# Patient Record
Sex: Female | Born: 1950 | Race: Black or African American | Hispanic: No | Marital: Single | Smoking: Current every day smoker
Health system: Southern US, Community
[De-identification: ages and names within clinical notes are randomized; demographics above are authoritative.]

## PROBLEM LIST (undated history)

## (undated) DIAGNOSIS — F319 Bipolar disorder, unspecified: Secondary | ICD-10-CM

## (undated) DIAGNOSIS — G8190 Hemiplegia, unspecified affecting unspecified side: Secondary | ICD-10-CM

## (undated) DIAGNOSIS — M1990 Unspecified osteoarthritis, unspecified site: Secondary | ICD-10-CM

## (undated) DIAGNOSIS — I639 Cerebral infarction, unspecified: Secondary | ICD-10-CM

## (undated) DIAGNOSIS — I878 Other specified disorders of veins: Secondary | ICD-10-CM

## (undated) DIAGNOSIS — K219 Gastro-esophageal reflux disease without esophagitis: Secondary | ICD-10-CM

## (undated) DIAGNOSIS — R600 Localized edema: Secondary | ICD-10-CM

## (undated) HISTORY — PX: KNEE ARTHROSCOPY: SUR90

---

## 2015-06-11 ENCOUNTER — Emergency Department (HOSPITAL_COMMUNITY)
Admission: EM | Admit: 2015-06-11 | Discharge: 2015-06-11 | Disposition: A | Discharge status: Home / Self Care | Payer: Medicare HMO | Attending: Emergency Medicine | Admitting: Emergency Medicine

## 2015-06-11 ENCOUNTER — Encounter (HOSPITAL_COMMUNITY): Payer: Self-pay | Admitting: Emergency Medicine

## 2015-06-11 DIAGNOSIS — Z8673 Personal history of transient ischemic attack (TIA), and cerebral infarction without residual deficits: Secondary | ICD-10-CM | POA: Diagnosis not present

## 2015-06-11 DIAGNOSIS — Y998 Other external cause status: Secondary | ICD-10-CM | POA: Insufficient documentation

## 2015-06-11 DIAGNOSIS — F319 Bipolar disorder, unspecified: Secondary | ICD-10-CM | POA: Insufficient documentation

## 2015-06-11 DIAGNOSIS — Z79899 Other long term (current) drug therapy: Secondary | ICD-10-CM | POA: Diagnosis not present

## 2015-06-11 DIAGNOSIS — Y9389 Activity, other specified: Secondary | ICD-10-CM | POA: Insufficient documentation

## 2015-06-11 DIAGNOSIS — Z7982 Long term (current) use of aspirin: Secondary | ICD-10-CM | POA: Diagnosis not present

## 2015-06-11 DIAGNOSIS — M1990 Unspecified osteoarthritis, unspecified site: Secondary | ICD-10-CM | POA: Diagnosis not present

## 2015-06-11 DIAGNOSIS — W19XXXA Unspecified fall, initial encounter: Secondary | ICD-10-CM

## 2015-06-11 DIAGNOSIS — Z8669 Personal history of other diseases of the nervous system and sense organs: Secondary | ICD-10-CM | POA: Diagnosis not present

## 2015-06-11 DIAGNOSIS — Z043 Encounter for examination and observation following other accident: Secondary | ICD-10-CM | POA: Insufficient documentation

## 2015-06-11 DIAGNOSIS — K219 Gastro-esophageal reflux disease without esophagitis: Secondary | ICD-10-CM | POA: Diagnosis not present

## 2015-06-11 DIAGNOSIS — Z880 Allergy status to penicillin: Secondary | ICD-10-CM | POA: Diagnosis not present

## 2015-06-11 DIAGNOSIS — W1789XA Other fall from one level to another, initial encounter: Secondary | ICD-10-CM | POA: Diagnosis not present

## 2015-06-11 DIAGNOSIS — Y9289 Other specified places as the place of occurrence of the external cause: Secondary | ICD-10-CM | POA: Diagnosis not present

## 2015-06-11 HISTORY — DX: Hemiplegia, unspecified affecting unspecified side: G81.90

## 2015-06-11 HISTORY — DX: Cerebral infarction, unspecified: I63.9

## 2015-06-11 HISTORY — DX: Unspecified osteoarthritis, unspecified site: M19.90

## 2015-06-11 HISTORY — DX: Gastro-esophageal reflux disease without esophagitis: K21.9

## 2015-06-11 HISTORY — DX: Bipolar disorder, unspecified: F31.9

## 2015-06-11 NOTE — ED Provider Notes (Signed)
CSN: [id number]     Arrival date & time 06/11/15  1244 History   None    Chief Complaint  Patient presents with  . Fall     (Consider location/radiation/quality/duration/timing/severity/associated sxs/prior Treatment) Patient is a 64 y.o. female presenting with fall. The history is provided by the patient and the EMS personnel. No language interpreter was used.  Fall Pertinent negatives include no abdominal pain, chest pain, headaches or vomiting.   Betty Buchanan is a 64 year old female with a history of a stroke and bipolar disorder who [facility] by [facility] for an unwitnessed fall while in the elevator. The patient states she tripped over her shoe while using her walker but denies any loss of consciousness or head injury. She states she takes daily aspirin for her heart. 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 She states she is not in any pain. She denies any recent illness, cough, shortness of breath, chest pain, headache, abdominal pain, vomiting, or diarrhea.  Past Medical History  Diagnosis Date  . Bipolar 1 disorder (HCC)   . Stroke (HCC)   . Hemiplegia (HCC)     L side  . Osteoarthritis   . GERD (gastroesophageal reflux disease)    History reviewed. No pertinent past surgical history. History reviewed. No pertinent family history. Social History  Substance Use Topics  . Smoking status: Never Smoker   . Smokeless tobacco: None  . Alcohol Use: No   OB History    No data available     Review of Systems  Respiratory: Negative for shortness of breath.   Cardiovascular: Negative for chest pain.  Gastrointestinal: Negative for vomiting and abdominal pain.  Skin: Negative for wound.  Neurological: Negative for syncope, facial asymmetry and headaches.  All other systems reviewed and are negative.     Allergies  Penicillins  Home Medications   Prior to Admission medications   Medication Sig Start  Date End Date Taking? Authorizing Provider  aspirin EC 81 MG tablet Take 81 mg by mouth daily with breakfast.   Yes Historical Provider, MD  atorvastatin (LIPITOR) 40 MG tablet Take 40 mg by mouth every evening.   Yes Historical Provider, MD  calcium carbonate (OS-CAL - DOSED IN MG OF ELEMENTAL CALCIUM) 1250 (500 CA) MG tablet Take 1 tablet by mouth daily with breakfast.   Yes Historical Provider, MD  divalproex (DEPAKOTE SPRINKLE) 125 MG capsule Take 500-1,000 mg by mouth 2 (two) times daily. Takes 500mg  in the morning and 1000mg  at night   Yes Historical Provider, MD  LORazepam (ATIVAN) 1 MG tablet Take 1 mg by mouth every 4 (four) hours as needed for anxiety (and agitation).   Yes Historical Provider, MD  Melatonin 1 MG TABS Take 2 mg by mouth at bedtime.   Yes Historical Provider, MD  Multiple Vitamin (DAILY VITE) TABS Take 1 tablet by mouth daily with breakfast.   Yes Historical Provider, MD  OLANZapine (ZYPREXA) 10 MG tablet Take 5 mg by mouth 2 (two) times daily. Takes along with the 1/2 tablet of a 20mg  tablet equals 15mg    Yes Historical Provider, MD  OLANZapine (ZYPREXA) 20 MG tablet Take 10 mg by mouth 2 (two) times daily. Takes along with 1/2 of a 10mg  tablet equals 15mg    Yes Historical Provider, MD  pantoprazole (PROTONIX) 40 MG tablet Take 40 mg by mouth daily with breakfast.   Yes Historical Provider, MD  thiothixene (NAVANE) 10  MG capsule Take 10 mg by mouth 3 (three) times daily.   Yes Historical Provider, MD   BP 126/82 mmHg  Pulse 64  Temp(Src) 98 F (36.7 C) (Oral)  Resp 16  SpO2 100% Physical Exam  Constitutional: She is oriented to person, place, and time. She appears well-developed and well-nourished.  HENT:  Head: Normocephalic and atraumatic. Head is without raccoon's eyes, without Battle's sign, without abrasion, without contusion, without laceration, without right periorbital erythema and without left periorbital erythema. Hair is normal.  No evidence of head  injury. No lacerations or abrasions.  Eyes: Conjunctivae are normal.  Neck: Normal range of motion. Neck supple.  Cardiovascular: Normal rate, regular rhythm and normal heart sounds.   Pulmonary/Chest: Effort normal and breath sounds normal.  Lungs clear to auscultation bilaterally. No wheezing or decreased breath sounds.  Abdominal: Soft. There is no tenderness.  Abdomen is soft and nontender. Nondistended. No guarding or rebound.  Musculoskeletal: Normal range of motion.  She is able to move all extremities without difficulty.  Neurological: She is alert and oriented to person, place, and time. She has normal strength. No sensory deficit. She displays a negative Romberg sign. GCS eye subscore is 4. GCS verbal subscore is 5. GCS motor subscore is 6.  She is able to answer questions appropriately. GCS of 15. Cranial nerves III through XII intact. Normal coordination. 5/5 strength with dorsi and plantar flexion and grip strength.  No facial droop or difficulty speaking.  Skin: Skin is warm and dry.  Psychiatric: She has a normal mood and affect. Her behavior is normal.  Nursing note and vitals reviewed.   ED Course  Procedures (including critical care time) Labs Review Labs Reviewed - No data to display  Imaging Review No results found.   EKG Interpretation None      MDM   Final diagnoses:  Accident due to mechanical fall without injury  Patient presents for mechanical fall after tripping over her shoe while using a walker. She is neurovascularly intact and able to tell me what happened. She has no altered mental status, chest pain, shortness of breath, or headache. She denies being any pain at this time.  She is on 81 mg of aspirin daily. She denies hitting her head or any loss of consciousness. I do not believe she needs imaging at this time. She was able to ambulate with assistance using a walker. I believe she is stable for discharge back to hold and headaches. Return  precautions were explained to her. She verbally agreed.    Catha Gosselin, PA-C 06/11/15 1527  Jerelyn Scott, MD 06/11/15 [phone]

## 2015-06-11 NOTE — ED Notes (Signed)
Pt ambulated to restroom with walker which is pt's norm.

## 2015-06-11 NOTE — ED Notes (Signed)
[facility] Expected date:  Expected time:  Means of arrival:  Comments: EMS-fall from wheelchair

## 2015-06-11 NOTE — ED Notes (Signed)
Per EMS. [facility] status [facility]. Hx of stroke and bipolar, oriented per norm. Today pt had a fall in the elevator. Pt denies hitting her head or having any pain. Sent here for evaluation following fall per nursing home policy. Pt on aspirin, but no other blood thinners.

## 2015-06-11 NOTE — Discharge Instructions (Signed)
Fall Prevention in Hospitals, Adult Follow-up with a primary care physician at [facility] or use the resource guide below to find a provider. Return for any headache, chest pain, shortness of breath, difficulty with speech or coordination. As a hospital patient, your condition and the treatments you receive can increase your risk for falls. Some additional risk factors for falls in a hospital include:  Being in an unfamiliar environment.  Being on bed rest.  Your surgery.  Taking certain medicines.  Your tubing requirements, such as intravenous (IV) therapy or catheters. It is important that you learn how to decrease fall risks while at the hospital. Below are important tips that can help prevent falls. SAFETY TIPS FOR PREVENTING FALLS Talk about your risk of falling.  Ask your health care provider why you are at risk for falling. Is it your medicine, illness, tubing placement, or something else?  Make a plan with your health care provider to keep you safe from falls.  Ask your health care provider or pharmacist about side effects of your medicines. Some medicines can make you dizzy or affect your coordination. Ask for help.  Ask for help before getting out of bed. You may need to press your call button.  Ask for assistance in getting safely to the toilet.  Ask for a walker or cane to be put at your bedside. Ask that most of the side rails on your bed be placed up before your health care provider leaves the room.  Ask family or friends to sit with you.  Ask for things that are out of your reach, such as your glasses, hearing aids, telephone, bedside table, or call button. Follow these tips to avoid falling:  Stay lying or seated, rather than standing, while waiting for help.  Wear rubber-soled slippers or shoes whenever you walk in the hospital.  Avoid quick, sudden movements.  Change positions slowly.  Sit on the side of your bed before standing.  Stand up slowly and  wait before you start to walk.  Let your health care provider know if there is a spill on the floor.  Pay careful attention to the medical equipment, electrical cords, and tubes around you.  When you need help, use your call button by your bed or in the bathroom. Wait for one of your health care providers to help you.  If you feel dizzy or unsure of your footing, return to bed and wait for assistance.  Avoid being distracted by the TV, telephone, or another person in your room.  Do not lean or support yourself on rolling objects, such as IV poles or bedside tables.   This information is not intended to replace advice given to you by your health care provider. Make sure you discuss any questions you have with your health care provider.   Document Released: 08/07/2000 Document Revised: 08/31/2014 Document Reviewed: 04/17/2012 Elsevier Interactive Patient Education 2016 [employer].  Emergency Department Resource Guide 1) Find a Doctor and Pay Out of Pocket Although you won't have to find out who is covered by your insurance plan, it is a good idea to ask around and get recommendations. You will then need to call the office and see if the doctor you have chosen will accept you as a new patient and what types of options they offer for patients who are self-pay. Some doctors offer discounts or will set up payment plans for their patients who do not have insurance, but you will need to ask so you  aren't surprised when you get to your appointment.  2) Contact Your Local Health Department Not all health departments have doctors that can see patients for sick visits, but many do, so it is worth a call to see if yours does. If you don't know where your local health department is, you can check in your phone book. The CDC also has a tool to help you locate your state's health department, and many state websites also have listings of all of their local health departments.  3) Find a Walk-in  Clinic If your illness is not likely to be very severe or complicated, you may want to try a walk in clinic. These are popping up all over the country in pharmacies, drugstores, and shopping centers. They're usually staffed by nurse practitioners or physician assistants that have been trained to treat common illnesses and complaints. They're usually fairly quick and inexpensive. However, if you have serious medical issues or chronic medical problems, these are probably not your best option.  No Primary Care Doctor: - Call Health Connect at  [phone] - they can help you locate a primary care doctor that  accepts your insurance, provides certain services, etc. - Physician Referral Service- [phone]  Chronic Pain Problems: Organization         Address  Phone   Notes  [facility]  [phone] Patients need to be referred by their primary care doctor.   Medication Assistance: Organization         Address  Phone   Notes  [facility] Medication [facility] [address] [place], [place] [address] [phone] --Must be a resident of [facility] -- Must have NO insurance coverage whatsoever (no Medicaid/ Medicare, etc.) -- The pt. MUST have a primary care doctor that directs their care regularly and follows them in the community   [facility]ist  [phone]   [employer]  [phone]    Agencies that provide inexpensive medical care: Organization         Address  Phone   Notes  [facility]  [phone]   [facility]    [phone]   [facility] [address] [place], [place] [address] [phone]   [facility] [address] [place] [place] [phone]   Planned Parenthood    [phone]   [facility]    [phone]   Community Health and [facility]  [address], [place] Phone:  [phone], Fax:  [phone] Hours  of Operation:  9 am - 6 pm, M-F.  Also accepts Medicaid/Medicare and self-pay.  [facility]  [address], [place] Phone: [phone], Fax: [phone]. Hours of Operation:  8:30 am - 5:30 pm, M-F.  Also accepts Medicaid and self-pay.  [facility] [address], [place] Phone: [phone]   [facility] [address] Natasha Bence [place], [place] [phone], Ext. [phone] Mondays & Thursdays: 7-9 AM.  First 15 patients are seen on a first come, first serve basis.    Medicaid-accepting [facility] Providers:  Organization         Address  Phone   Notes  [facility] [address], [place] [phone] Also accepts self-pay patients.  [facility] [address] Laurell Josephs [place], [place]  [phone]  [place], [address], [place] [phone]   [place] [address], [place] [phone]   Lucianne Lei [address], [place]   [phone] Only accepts [place] Access [place] patients after they have their name applied to their card.   Self-Pay (no insurance) in [facility]:  Organization         Address  Phone   Notes  Sickle Cell Patients, [facility] [place] [phone]   [facility] Urgent Care [place] [phone]   [facility] [place]  [place], [place], [place] [phone]   [facility]/Dr. Osei-Bonsu  [address] [place] or [address], [place] [phone] Phone number for both [place] and [place] locations is the same.  Urgent Medical and [facility] [address], [place] [phone]   [place] [facility] [address], [place] or [address] [phone]   [facility] [address] [place] [phone], phone; [phone], fax Sees patients 1st and 3rd Saturday of every month.  Must not qualify for public or private insurance (i.e. Medicaid, Medicare, [place] Health Choice, Veterans' Benefits)  Household income should be no more than 200% of the poverty level The clinic cannot treat you if you are pregnant or think you are pregnant  Sexually transmitted diseases are not treated at the clinic.    Dental Care: Organization         Address  Phone  Notes  [facility] Department of [facility] [phone] Accepts children up to age 64 who are enrolled in [place] or [place]; pregnant women with a Medicaid card; and children who have applied for Medicaid or [place] Health Choice, but were declined, whose parents can pay a reduced fee at time of service.  [facility] Department of [facility]  [address], [place] [phone] Accepts children up to age 4 who are enrolled in [place] or [place]; pregnant women with a Medicaid card; and children who have applied for Medicaid or [place] Health Choice, but were declined, whose parents can pay a reduced fee at time of service.  [place] Adult Dental Access PROGRAM  [place] [phone] Patients are seen by appointment only. Walk-ins are not accepted. [place] will see patients 4 years of age and older. Monday - Tuesday (8am-5pm) Most Wednesdays (8:30-5pm) $30 per visit, cash only  [facility] Adult Dental Access PROGRAM  [address], [facility] [phone] Patients are seen by appointment only. Walk-ins are not accepted. [place] will see patients 44 years of age and older. One Wednesday Evening (Monthly: Volunteer Based).  $30 per visit, cash only  [place]  [phone] for adults; Children under age 48, call Graduate  Pediatric Dentistry at [phone]. Children aged 73-14, please call [phone] to request a pediatric application.  Dental services are provided in all areas of dental care including fillings, crowns and bridges, complete and partial dentures, implants, gum treatment, root canals, and extractions. Preventive care is also provided. Treatment is provided to both adults and children. Patients are selected via a lottery and there is often a waiting list.  [facility] [address], [place]  [phone] [email or web address]   [facility] [address] [place], [place] [phone], Ext. [phone] Second and Fourth Thursday of each month, opens at 6:30 AM; Clinic ends at 9 AM.  Patients are seen on a first-come first-served basis, and a limited number are seen during each clinic.   [facility]  [address] Hillard Danker [place], [place] [phone]   Eligibility Requirements You must have lived in [place], [place], or [place] counties for at least the last three months.   You cannot be eligible for state or federal sponsored [employer], including [employer], [place], or [employer].   You generally cannot be eligible for healthcare insurance through your employer.    How to apply: Eligibility screenings are held every Tuesday and Wednesday afternoon from 1:00 pm until 4:00 pm. You do not need an appointment for the interview!  [facility] [address], [place], [place]   [place]  [place] Department  [place]  [phone]    Behavioral Health Resources in the Community: Intensive Outpatient Programs Organization         Address  Phone  Notes  [place] [place]. [address], [place], [place] [phone]   [facility] [address] [place], [place]   ADS: Alcohol & Drug Svcs [address] [place], [place]   Trainer [address]  [employer], [place] or [phone]   Substance Abuse Resources Organization         Address  Phone  Notes  Alcohol and Drug Services  [phone]   [place]  [phone]   [facility] [place]   Chinita Pester  [phone]   Residential & Outpatient Substance Abuse Program  [phone]   Psychological Services Organization         Address  Phone  Notes  [facility]  [phone]   [place] [address] [place] or [phone]    Mobile Crisis Teams Organization         Address  Phone  Notes  [facility], Mobile Crisis Care Unit  [phone]   [facility]  [address] [place], [place]   [place] [address] [place] [phone]    Self-Help/Support Groups Organization         Address  Phone             Notes  [place]. of [place] - variety of support groups  [place] Call for more information  Narcotics Anonymous (NA), [facility] [address], [employer] [place]  2 meetings at this location   Special educational needs teacher         Address  Phone  Notes  [facility]idential Treatment [place],    [place]  [phone]   [facility] [facility]  [address], [place] 937169, [place], [place]   [place], [place] [phone] Admissions: 8am-3pm M-F  Incentives Substance [place] [address],    [place], [place] [phone]   [facility] [address] [place], [place], [place]   The [place].,  [place], [place] [phone]   [facility] - Intensive Outpatient [address]ure[address] Josephs[address], [place], [place] [phone]   [facility] ([facility] [address]  [place], [place] [phone] or [phone]   [facility] (RTS) [address] [place], [place] [phone] Accepts Medicaid  [facility] [address]  [place] [phone] Substance Abuse/Addiction Treatment   [facility] Behavioral Health Resources Organization         Address  Phone  Notes  [facility]  [phone]) [phone]   Angie FavaJulie Brannon, PhD [address] [place], [place]   [phone](336) [phone] or [phone]   [facility]   [address] [place], [place] [phone](336) [phone]   [facility] [address], [place], [place] [phone] Insurance/Medicaid/sponsorship through [facility]  [facility] [address]                                    [place], [place] [phone]) [phone] Therapy/tele-psych/case  [facility] [address]   [place], [place] [phone]) [phone]    Dr. Lolly MustacheArfeen  [phone](336) [phone]   [facility]  [employer] [facility] 1) [address], [place] 2) [address], [place] 3)  [address] [place], [place] [phone](336) [phone]  [phone]   [facility] Child Abuse Hotline [phone](336) [phone] or [phone](336) [phone] (After Hours)

## 2015-07-08 ENCOUNTER — Emergency Department (HOSPITAL_COMMUNITY)
Admission: EM | Admit: 2015-07-08 | Discharge: 2015-07-09 | Disposition: A | Discharge status: Home / Self Care | Payer: Medicare HMO | Attending: Emergency Medicine | Admitting: Emergency Medicine

## 2015-07-08 ENCOUNTER — Encounter (HOSPITAL_COMMUNITY): Payer: Self-pay | Admitting: Emergency Medicine

## 2015-07-08 DIAGNOSIS — W19XXXA Unspecified fall, initial encounter: Secondary | ICD-10-CM

## 2015-07-08 DIAGNOSIS — Z7982 Long term (current) use of aspirin: Secondary | ICD-10-CM | POA: Diagnosis not present

## 2015-07-08 DIAGNOSIS — F319 Bipolar disorder, unspecified: Secondary | ICD-10-CM | POA: Diagnosis not present

## 2015-07-08 DIAGNOSIS — W06XXXA Fall from bed, initial encounter: Secondary | ICD-10-CM | POA: Insufficient documentation

## 2015-07-08 DIAGNOSIS — Y92129 Unspecified place in nursing home as the place of occurrence of the external cause: Secondary | ICD-10-CM | POA: Insufficient documentation

## 2015-07-08 DIAGNOSIS — Z043 Encounter for examination and observation following other accident: Secondary | ICD-10-CM | POA: Insufficient documentation

## 2015-07-08 DIAGNOSIS — K219 Gastro-esophageal reflux disease without esophagitis: Secondary | ICD-10-CM | POA: Insufficient documentation

## 2015-07-08 DIAGNOSIS — Z8673 Personal history of transient ischemic attack (TIA), and cerebral infarction without residual deficits: Secondary | ICD-10-CM | POA: Insufficient documentation

## 2015-07-08 DIAGNOSIS — N390 Urinary tract infection, site not specified: Secondary | ICD-10-CM | POA: Insufficient documentation

## 2015-07-08 DIAGNOSIS — Z880 Allergy status to penicillin: Secondary | ICD-10-CM | POA: Diagnosis not present

## 2015-07-08 DIAGNOSIS — Y998 Other external cause status: Secondary | ICD-10-CM | POA: Diagnosis not present

## 2015-07-08 DIAGNOSIS — Z8669 Personal history of other diseases of the nervous system and sense organs: Secondary | ICD-10-CM | POA: Insufficient documentation

## 2015-07-08 DIAGNOSIS — Z79899 Other long term (current) drug therapy: Secondary | ICD-10-CM | POA: Insufficient documentation

## 2015-07-08 DIAGNOSIS — Y9389 Activity, other specified: Secondary | ICD-10-CM | POA: Diagnosis not present

## 2015-07-08 DIAGNOSIS — M1990 Unspecified osteoarthritis, unspecified site: Secondary | ICD-10-CM | POA: Diagnosis not present

## 2015-07-08 NOTE — ED Notes (Signed)
Patient had an unwitnessed fall [facility]. Patient is not complaining of any pain.

## 2015-07-08 NOTE — ED Notes (Signed)
Bed: WA12 Expected date:  Expected time:  Means of arrival:  Comments: EMS 

## 2015-07-08 NOTE — ED Provider Notes (Signed)
CSN: [id number]     Arrival date & time 07/08/15  2317 History  By signing my name below, I, Phillis HaggisGabriella Gaje, attest that this documentation has been prepared under the direction and in the presence of Cova Knieriem Se. Electronically Signed: Phillis HaggisGabriella Gaje, ED Scribe. 07/08/2015. 11:29 PM.   Chief Complaint  Patient presents with  . Fall   The history is provided by the patient. No language interpreter was used.  HPI Comments: Betty Buchanan is a 64 y.o. Female with hx of stroke, hemiplegia and osteoarthritis brought in by EMS who presents to the Emergency Department complaining of a fall onset PTA. Pt had an unwitnessed fall at [facility]. Pt states that she was trying to lean over her bed to get a snack and tipped over out of bed. She denies hitting head and currently denies pain. Nurse denies that pt has fever.   Past Medical History  Diagnosis Date  . Bipolar 1 disorder (HCC)   . Stroke (HCC)   . Hemiplegia (HCC)     L side  . Osteoarthritis   . GERD (gastroesophageal reflux disease)    History reviewed. No pertinent past surgical history. History reviewed. No pertinent family history. Social History  Substance Use Topics  . Smoking status: Never Smoker   . Smokeless tobacco: None  . Alcohol Use: No   OB History    No data available     Review of Systems  Constitutional: Negative for fever.  Neurological: Negative for syncope and headaches.  All other systems reviewed and are negative.   Allergies  Penicillins  Home Medications   Prior to Admission medications   Medication Sig Start Date End Date Taking? Authorizing Provider  aspirin EC 81 MG tablet Take 81 mg by mouth daily with breakfast.    Historical Provider, MD  atorvastatin (LIPITOR) 40 MG tablet Take 40 mg by mouth every evening.    Historical Provider, MD  calcium carbonate (OS-CAL - DOSED IN MG OF ELEMENTAL CALCIUM) 1250 (500 CA) MG tablet Take 1 tablet by mouth daily with breakfast.     Historical Provider, MD  divalproex (DEPAKOTE SPRINKLE) 125 MG capsule Take 500-1,000 mg by mouth 2 (two) times daily. Takes 500mg  in the morning and 1000mg  at night    Historical Provider, MD  LORazepam (ATIVAN) 1 MG tablet Take 1 mg by mouth every 4 (four) hours as needed for anxiety (and agitation).    Historical Provider, MD  Melatonin 1 MG TABS Take 2 mg by mouth at bedtime.    Historical Provider, MD  Multiple Vitamin (DAILY VITE) TABS Take 1 tablet by mouth daily with breakfast.    Historical Provider, MD  OLANZapine (ZYPREXA) 10 MG tablet Take 5 mg by mouth 2 (two) times daily. Takes along with the 1/2 tablet of a 20mg  tablet equals 15mg     Historical Provider, MD  OLANZapine (ZYPREXA) 20 MG tablet Take 10 mg by mouth 2 (two) times daily. Takes along with 1/2 of a 10mg  tablet equals 15mg     Historical Provider, MD  pantoprazole (PROTONIX) 40 MG tablet Take 40 mg by mouth daily with breakfast.    Historical Provider, MD  thiothixene (NAVANE) 10 MG capsule Take 10 mg by mouth 3 (three) times daily.    Historical Provider, MD   BP 128/68 mmHg  Pulse 72  Temp(Src) 98.2 F (36.8 C) (Oral)  Resp 18  Ht 5\' 5"  (1.651 m)  Wt 200 lb (90.719 kg)  BMI 33.28 kg/m2  SpO2 98%  Physical Exam  Constitutional: She is oriented to person, place, and time. She appears well-developed and well-nourished. No distress.  HENT:  Head: Normocephalic and atraumatic.  Eyes: Conjunctivae and EOM are normal.  Neck: Normal range of motion. Neck supple.  Cardiovascular: Normal rate and regular rhythm.  Exam reveals no gallop and no friction rub.   No murmur heard. Pulmonary/Chest: Effort normal and breath sounds normal. She has no wheezes. She has no rales. She exhibits no tenderness.  Abdominal: Soft. She exhibits no distension. There is no tenderness.  Musculoskeletal: Normal range of motion.  Neurological: She is alert and oriented to person, place, and time. Coordination normal.  Speech is goal-oriented.  Moves limbs without ataxia.   Skin: Skin is warm and dry.  Psychiatric: She has a normal mood and affect. Her behavior is normal.  Nursing note and vitals reviewed.   ED Course  Procedures (including critical care time) DIAGNOSTIC STUDIES: Oxygen Saturation is 98% on RA, normal by my interpretation.    COORDINATION OF CARE: 11:38 PM-Discussed treatment plan with pt at bedside and pt agreed to plan.   Labs Review Labs Reviewed  URINALYSIS, ROUTINE W REFLEX MICROSCOPIC (NOT AT [facility]) - Abnormal; Notable for the following:    APPearance TURBID (*)    Hgb urine dipstick LARGE (*)    Protein, ur 100 (*)    Nitrite POSITIVE (*)    Leukocytes, UA LARGE (*)    All other components within normal limits  URINE MICROSCOPIC-ADD ON - Abnormal; Notable for the following:    Squamous Epithelial / LPF NONE SEEN (*)    Bacteria, UA FEW (*)    All other components within normal limits  URINE CULTURE    Imaging Review Ct Head Wo Contrast  07/09/2015  CLINICAL DATA:  Larey Seat out of bed while leaning over trying to get snack. Concern for head injury. Initial encounter. EXAM: CT HEAD WITHOUT CONTRAST TECHNIQUE: Contiguous axial images were obtained from the base of the skull through the vertex without intravenous contrast. COMPARISON:  None. FINDINGS: There is no evidence of acute infarction, mass lesion, or intra- or extra-axial hemorrhage on CT. Chronic encephalomalacia is noted at the right parietal lobe, reflecting remote infarct. This involves portions of the right basal ganglia. The posterior fossa, including the cerebellum, brainstem and fourth ventricle, is within normal limits. The third and lateral ventricles are unremarkable in appearance. No mass effect or midline shift is seen. There is no evidence of fracture; visualized osseous structures are unremarkable in appearance. The visualized portions of the orbits are within normal limits. The paranasal sinuses and mastoid air cells are well-aerated.  No significant soft tissue abnormalities are seen. IMPRESSION: 1. No evidence of traumatic intracranial injury or fracture. 2. Chronic encephalomalacia at the right parietal lobe, reflecting remote infarct. Electronically Signed   By: Roanna Raider M.D.   On: 07/09/2015 01:34   I have personally reviewed and evaluated these images and lab results as part of my medical decision-making.   EKG Interpretation None      MDM   Final diagnoses:  Fall, initial encounter  UTI (lower urinary tract infection)   Patient's head CT unremarkable for acute changes. Patient has a UTI and will be treated with bactrim. Vitals stable and patient afebrile.   I personally performed the services described in this documentation, which was scribed in my presence. The recorded information has been reviewed and is accurate.     Emilia Beck, PA-C 07/09/15 [phone]  Devoria Albe, MD 07/09/15 [phone]

## 2015-07-09 ENCOUNTER — Emergency Department (HOSPITAL_COMMUNITY): Payer: Medicare HMO

## 2015-07-09 DIAGNOSIS — Z043 Encounter for examination and observation following other accident: Secondary | ICD-10-CM | POA: Diagnosis not present

## 2015-07-09 LAB — URINE MICROSCOPIC-ADD ON: Squamous Epithelial / LPF: NONE SEEN — AB

## 2015-07-09 LAB — URINALYSIS, ROUTINE W REFLEX MICROSCOPIC
Bilirubin Urine: NEGATIVE
GLUCOSE, UA: NEGATIVE mg/dL
Ketones, ur: NEGATIVE mg/dL
Nitrite: POSITIVE — AB
PH: 7.5 (ref 5.0–8.0)
PROTEIN: 100 mg/dL — AB
Specific Gravity, Urine: 1.019 (ref 1.005–1.030)
Urobilinogen, UA: 1 mg/dL (ref 0.0–1.0)

## 2015-07-09 MED ORDER — SULFAMETHOXAZOLE-TRIMETHOPRIM 800-160 MG PO TABS
1.0000 | ORAL_TABLET | Freq: Once | ORAL | Status: AC
  Administered 2015-07-09: 1 via ORAL
  Filled 2015-07-09: qty 1

## 2015-07-09 MED ORDER — SULFAMETHOXAZOLE-TRIMETHOPRIM 800-160 MG PO TABS: 1.0000 | ORAL_TABLET | Freq: Two times a day (BID) | ORAL | Status: AC

## 2015-07-09 NOTE — Discharge Instructions (Signed)
Take Bactrim as directed until gone. Refer to attached documents for more information.  °

## 2015-07-09 NOTE — ED Notes (Signed)
Patient refused CT scan.

## 2015-07-10 ENCOUNTER — Encounter (HOSPITAL_COMMUNITY): Payer: Self-pay | Admitting: *Deleted

## 2015-07-10 ENCOUNTER — Emergency Department (HOSPITAL_COMMUNITY)
Admission: EM | Admit: 2015-07-10 | Discharge: 2015-07-10 | Disposition: A | Discharge status: Home / Self Care | Payer: Medicare HMO | Attending: Physician Assistant | Admitting: Physician Assistant

## 2015-07-10 DIAGNOSIS — Z79899 Other long term (current) drug therapy: Secondary | ICD-10-CM | POA: Insufficient documentation

## 2015-07-10 DIAGNOSIS — G8190 Hemiplegia, unspecified affecting unspecified side: Secondary | ICD-10-CM | POA: Insufficient documentation

## 2015-07-10 DIAGNOSIS — W06XXXA Fall from bed, initial encounter: Secondary | ICD-10-CM | POA: Insufficient documentation

## 2015-07-10 DIAGNOSIS — Y9389 Activity, other specified: Secondary | ICD-10-CM | POA: Diagnosis not present

## 2015-07-10 DIAGNOSIS — Z792 Long term (current) use of antibiotics: Secondary | ICD-10-CM | POA: Diagnosis not present

## 2015-07-10 DIAGNOSIS — Z043 Encounter for examination and observation following other accident: Secondary | ICD-10-CM | POA: Insufficient documentation

## 2015-07-10 DIAGNOSIS — M1990 Unspecified osteoarthritis, unspecified site: Secondary | ICD-10-CM | POA: Insufficient documentation

## 2015-07-10 DIAGNOSIS — K219 Gastro-esophageal reflux disease without esophagitis: Secondary | ICD-10-CM | POA: Insufficient documentation

## 2015-07-10 DIAGNOSIS — Z8673 Personal history of transient ischemic attack (TIA), and cerebral infarction without residual deficits: Secondary | ICD-10-CM | POA: Insufficient documentation

## 2015-07-10 DIAGNOSIS — Y998 Other external cause status: Secondary | ICD-10-CM | POA: Insufficient documentation

## 2015-07-10 DIAGNOSIS — Z880 Allergy status to penicillin: Secondary | ICD-10-CM | POA: Insufficient documentation

## 2015-07-10 DIAGNOSIS — Y92129 Unspecified place in nursing home as the place of occurrence of the external cause: Secondary | ICD-10-CM | POA: Insufficient documentation

## 2015-07-10 DIAGNOSIS — F319 Bipolar disorder, unspecified: Secondary | ICD-10-CM | POA: Diagnosis not present

## 2015-07-10 DIAGNOSIS — Z7982 Long term (current) use of aspirin: Secondary | ICD-10-CM | POA: Diagnosis not present

## 2015-07-10 DIAGNOSIS — Z0000 Encounter for general adult medical examination without abnormal findings: Secondary | ICD-10-CM

## 2015-07-10 NOTE — Progress Notes (Addendum)
pcp is [facility]- Doctors making house calls [phone] [address] [place] [phone] Cm went to see pt in triage and pt was sitting on stretcher She burst out in tears as she was providing her pcp name Pt [facility]  CM unable to find Vankirk in EPIC  [facility]

## 2015-07-10 NOTE — ED Provider Notes (Signed)
CSN: [id number]     Arrival date & time 07/10/15  1617 History   First MD Initiated Contact with Patient 07/10/15 1633     Chief Complaint  Patient presents with  . 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 Patient reports that she did not fall, she just lowered herself down to the ground and then could not get backup. Patient denies any pain anywhere. She is perseverating over the fact that she would like to talk to her doctor whose prescribed her Lipitor 2 years ago. She is concerned that her Lipitor is the wrong dose.Marland Kitchen  Past Medical History  Diagnosis Date  . Bipolar 1 disorder (HCC)   . Stroke (HCC)   . Hemiplegia (HCC)     L side  . Osteoarthritis   . GERD (gastroesophageal reflux disease)    History reviewed. No pertinent past surgical history. History reviewed. No pertinent family history. Social History  Substance Use Topics  . Smoking status: Never Smoker   . Smokeless tobacco: None  . Alcohol Use: No   OB History    No data available     Review of Systems  Unable to perform ROS: Psychiatric disorder      Allergies  Penicillins  Home Medications   Prior to Admission medications   Medication Sig Start Date End Date Taking? Authorizing Provider  aspirin EC 81 MG tablet Take 81 mg by mouth daily with breakfast.    Historical Provider, MD  atorvastatin (LIPITOR) 40 MG tablet Take 40 mg by mouth every evening.    Historical Provider, MD  calcium carbonate (OS-CAL - DOSED IN MG OF ELEMENTAL CALCIUM) 1250 (500 CA) MG tablet Take 1 tablet by mouth daily with breakfast.    Historical Provider, MD  divalproex (DEPAKOTE SPRINKLE) 125 MG capsule Take 500-1,000 mg by mouth 2 (two) times daily. Takes  in the morning and  at night    Historical Provider, MD   LORazepam (ATIVAN) 1 MG tablet Take 1 mg by mouth every 4 (four) hours as needed for anxiety (and agitation).    Historical Provider, MD  Melatonin 1 MG TABS Take 2 mg by mouth at bedtime.    Historical Provider, MD  Multiple Vitamin (DAILY VITE) TABS Take 1 tablet by mouth daily with breakfast.    Historical Provider, MD  OLANZapine (ZYPREXA) 20 MG tablet Take 10 mg by mouth daily.     Historical Provider, MD  pantoprazole (PROTONIX) 40 MG tablet Take 40 mg by mouth daily with breakfast.    Historical Provider, MD  sulfamethoxazole-trimethoprim (BACTRIM DS,SEPTRA DS) 800-160 MG tablet Take 1 tablet by mouth 2 (two) times daily. 07/09/15 07/16/15  Emilia Beck, PA-C  thiothixene (NAVANE) 10 MG capsule Take 10 mg by mouth 2 (two) times daily.     Historical Provider, MD   BP 138/80 mmHg  Resp 20  SpO2 96% Physical Exam  Constitutional: She appears well-developed and well-nourished.  HENT:  Head: Normocephalic and atraumatic.  Bilateral proptosis  Eyes: Conjunctivae are normal. Right eye exhibits no discharge.  Neck: Neck supple.  Cardiovascular: Normal rate, regular rhythm and normal heart sounds.   No murmur heard. Pulmonary/Chest: Effort normal and breath [id number]. She has no wheezes. She has no rales.  Abdominal: Soft. She exhibits no distension. There is no tenderness.  Musculoskeletal: Normal range of motion. She  exhibits no edema.  Neurological: No cranial nerve deficit.  Hemiplegia.  Skin: Skin is warm and dry. No rash noted. She is not diaphoretic.  No evidence of trauma.  Psychiatric:  Patient had odd affect.  Nursing note and vitals reviewed.   ED Course  Procedures (including critical care time) Labs Review Labs Reviewed - No data to display  Imaging Review Ct Head Wo Contrast  07/09/2015  CLINICAL DATA:  Larey SeatFell out of bed while leaning over trying to get snack. Concern for head injury. Initial encounter. EXAM: CT HEAD WITHOUT CONTRAST TECHNIQUE: Contiguous  axial images were obtained from the base of the skull through the vertex without intravenous contrast. COMPARISON:  None. FINDINGS: There is no evidence of acute infarction, mass lesion, or intra- or extra-axial hemorrhage on CT. Chronic encephalomalacia is noted at the right parietal lobe, reflecting remote infarct. This involves portions of the right basal ganglia. The posterior fossa, including the cerebellum, brainstem and fourth ventricle, is within normal limits. The third and lateral ventricles are unremarkable in appearance. No mass effect or midline shift is seen. There is no evidence of fracture; visualized osseous structures are unremarkable in appearance. The visualized portions of the orbits are within normal limits. The paranasal sinuses and mastoid air cells are well-aerated. No significant soft tissue abnormalities are seen. IMPRESSION: 1. No evidence of traumatic intracranial injury or fracture. 2. Chronic encephalomalacia at the right parietal lobe, reflecting remote infarct. Electronically Signed   By: Roanna RaiderJeffery  Chang M.D.   On: 07/09/2015 01:34   I have personally reviewed and evaluated these images and lab results as part of my medical decision-making.   EKG Interpretation None      MDM   Final diagnoses:  None    Patient is a 64 year old female with history of bipolar disorder, hemiplegia. Patient states that she fell prior to arrival. There is no evidence of external trauma on her. Patient is perseverating over calling her doctor who prescribed her Lipitor over 2 years ago. I stressed that the morning might be a better time to call her about this. Patient is in no acute distress. Moving all extremities. No external evidence of trauma. Normal vital signs and physical exam.    Shalin Linders Randall AnLyn Evertte Sones, MD 07/10/15 2321

## 2015-07-10 NOTE — ED Notes (Signed)
Patient stated she "slipped and slid to the floor". Patient c/o of no pain . [facility]

## 2015-07-11 LAB — URINE CULTURE: Special Requests: NORMAL

## 2015-07-12 ENCOUNTER — Telehealth (HOSPITAL_COMMUNITY): Payer: Self-pay

## 2015-07-12 NOTE — Telephone Encounter (Signed)
Post ED Visit - Positive Culture Follow-up  Culture report reviewed by antimicrobial stewardship pharmacist:  []  Enzo BiNathan Batchelder, Pharm.D. []  Celedonio MiyamotoJeremy Frens, Pharm.D., BCPS []  Garvin FilaMike Maccia, Pharm.D. []  Georgina PillionElizabeth Martin, Pharm.D., BCPS []  RockleighMinh Pham, [address]Ph[address]D., BCPS, AAHIVP []  Estella HuskMichelle Turner, Pharm.D., BCPS, AAHIVP []  Tennis Mustassie Stewart, [address]Pharm[address]D. []  Sherle Poeob Vincent, [place]. Bernie CoveyX  Taylor Stone, Pharm.D.  Positive urine culture, >/= 100,000 colonies -> E Coli Treated with Sulfa-Trimeth, organism sensitive to the same and no further patient follow-up is required at this time.  Arvid RightClark, Frederich Montilla Dorn 07/12/2015, 9:23 AM

## 2015-07-22 ENCOUNTER — Emergency Department (HOSPITAL_COMMUNITY)
Admission: EM | Admit: 2015-07-22 | Discharge: 2015-07-22 | Disposition: A | Discharge status: Home / Self Care | Payer: Medicare HMO | Attending: Emergency Medicine | Admitting: Emergency Medicine

## 2015-07-22 ENCOUNTER — Encounter (HOSPITAL_COMMUNITY): Payer: Self-pay | Admitting: Emergency Medicine

## 2015-07-22 DIAGNOSIS — Y9301 Activity, walking, marching and hiking: Secondary | ICD-10-CM | POA: Insufficient documentation

## 2015-07-22 DIAGNOSIS — Y998 Other external cause status: Secondary | ICD-10-CM | POA: Insufficient documentation

## 2015-07-22 DIAGNOSIS — W01198A Fall on same level from slipping, tripping and stumbling with subsequent striking against other object, initial encounter: Secondary | ICD-10-CM | POA: Insufficient documentation

## 2015-07-22 DIAGNOSIS — Z880 Allergy status to penicillin: Secondary | ICD-10-CM | POA: Diagnosis not present

## 2015-07-22 DIAGNOSIS — Z8673 Personal history of transient ischemic attack (TIA), and cerebral infarction without residual deficits: Secondary | ICD-10-CM | POA: Insufficient documentation

## 2015-07-22 DIAGNOSIS — S0992XA Unspecified injury of nose, initial encounter: Secondary | ICD-10-CM | POA: Insufficient documentation

## 2015-07-22 DIAGNOSIS — F319 Bipolar disorder, unspecified: Secondary | ICD-10-CM | POA: Diagnosis not present

## 2015-07-22 DIAGNOSIS — Z79899 Other long term (current) drug therapy: Secondary | ICD-10-CM | POA: Diagnosis not present

## 2015-07-22 DIAGNOSIS — S01511A Laceration without foreign body of lip, initial encounter: Secondary | ICD-10-CM

## 2015-07-22 DIAGNOSIS — M1990 Unspecified osteoarthritis, unspecified site: Secondary | ICD-10-CM | POA: Diagnosis not present

## 2015-07-22 DIAGNOSIS — K219 Gastro-esophageal reflux disease without esophagitis: Secondary | ICD-10-CM | POA: Insufficient documentation

## 2015-07-22 DIAGNOSIS — Y9289 Other specified places as the place of occurrence of the external cause: Secondary | ICD-10-CM | POA: Insufficient documentation

## 2015-07-22 DIAGNOSIS — Z7982 Long term (current) use of aspirin: Secondary | ICD-10-CM | POA: Insufficient documentation

## 2015-07-22 DIAGNOSIS — G8190 Hemiplegia, unspecified affecting unspecified side: Secondary | ICD-10-CM | POA: Insufficient documentation

## 2015-07-22 DIAGNOSIS — W19XXXA Unspecified fall, initial encounter: Secondary | ICD-10-CM

## 2015-07-22 LAB — URINALYSIS, ROUTINE W REFLEX MICROSCOPIC
Bilirubin Urine: NEGATIVE
GLUCOSE, UA: NEGATIVE mg/dL
Hgb urine dipstick: NEGATIVE
Ketones, ur: NEGATIVE mg/dL
LEUKOCYTES UA: NEGATIVE
Nitrite: NEGATIVE
PH: 7 (ref 5.0–8.0)
Protein, ur: NEGATIVE mg/dL
SPECIFIC GRAVITY, URINE: 1.01 (ref 1.005–1.030)

## 2015-07-22 MED ORDER — LIDOCAINE HCL (PF) 1 % IJ SOLN
5.0000 mL | Freq: Once | INTRAMUSCULAR | Status: AC
  Administered 2015-07-22: 5 mL via INTRADERMAL
  Filled 2015-07-22: qty 5

## 2015-07-22 NOTE — ED Notes (Signed)
Awake. Verbally responsive. A/O x4. Resp even and unlabored. No audible adventitious breath sounds noted. ABC's intact. Lorelle FormosaHanna, PA at bedside to suture lac. Pt tolerated well.

## 2015-07-22 NOTE — ED Provider Notes (Signed)
MSE was initiated and I personally evaluated the patient and placed orders (if any) at  12:29 PM on July 22, 2015.  Patient was initially evaluated by Dr. Madilyn Hookees and I performed laceration repair.  LACERATION REPAIR Performed by: Catha GosselinPatel-Mills, Vannary Greening Authorized by: Catha GosselinPatel-Mills, Attila Mccarthy Consent: Verbal consent obtained. Risks and benefits: risks, benefits and alternatives were discussed Consent given by: patient Patient identity confirmed: provided demographic data Prepped and Draped in normal sterile fashion in bloodless field Wound explored and no foreign bodies noted Laceration Location: left upper lip Laceration Length: 1.5 cm flap laceration No Foreign Bodies seen or palpated Anesthesia: local infiltration Local anesthetic: lidocaine 1% w/o  epinephrine Anesthetic total: 1/2 ml Irrigation method: syringe with saline Amount of cleaning: standard Skin closure: 5-0 chromic gut Number of sutures: 4 Technique: simple interupted Patient tolerance: Patient tolerated the procedure well with no immediate complications.   Catha GosselinHanna Patel-Mills, PA-C 07/22/15 1229  Tilden FossaElizabeth Rees, MD 07/24/15 [phone]

## 2015-07-22 NOTE — ED Notes (Signed)
Pt walked with walker 

## 2015-07-22 NOTE — Discharge Instructions (Signed)
You had a urinalysis performed in the Emergency Department that was normal.  Get the sutures removed from your lip in one week if they do not fall out on their own.   Mouth Laceration A mouth laceration is a deep cut in the lining of your mouth (mucosa). The laceration may extend into your lip or go all of the way through your mouth and cheek. Lacerations inside your mouth may involve your tongue, the insides of your cheeks, or the upper surface of your mouth (palate). Mouth lacerations may bleed a lot because your mouth has a very rich blood supply. Mouth lacerations may need to be repaired with stitches (sutures). CAUSES Any type of facial injury can cause a mouth laceration. Common causes include:  Getting hit in the mouth.  Being in a car accident. SYMPTOMS The most common sign of a mouth laceration is bleeding that fills the mouth. DIAGNOSIS Your health care provider can diagnose a mouth laceration by examining your mouth. Your mouth may need to be washed out (irrigated) with a sterile salt-water (saline) solution. Your health care provider may also have to remove any blood clots to determine how bad your injury is. You may need X-rays of the bones in your jaw or your face to rule out other injuries, such as dental injuries, facial fractures, or jaw fractures. TREATMENT Treatment depends on the location and severity of your injury. Small mouth lacerations may not need treatment if bleeding has stopped. You may need sutures if:  You have a tongue laceration.  Your mouth laceration is large or deep, or it continues to bleed. If sutures are necessary, your health care provider will use absorbable sutures that dissolve as your body heals. You may also receive antibiotic medicine or a tetanus shot. HOME CARE INSTRUCTIONS  Take medicines only as directed by your health care provider.  If you were prescribed an antibiotic medicine, finish all of it even if you start to feel better.  Eat as  directed by your health care provider. You may only be able to drink liquids or eat soft foods for a few days.  Rinse your mouth with a warm, salt-water rinse 4-6 times per day or as directed by your health care provider. You can make a salt-water rinse by mixing one tsp of salt into two cups of warm water.  Do not poke the sutures with your tongue. Doing that can loosen them.  Check your wound every day for signs of infection. It is normal to have a white or gray patch over your wound while it heals. Watch for:  Redness.  Swelling.  Blood or pus.  Maintain regular oral hygiene, if possible. Gently brush your teeth with a soft, nylon-bristled toothbrush 2 times per day.  Keep all follow-up visits as directed by your health care provider. This is important. SEEK MEDICAL CARE IF:  You were given a tetanus shot and have swelling, severe pain, redness, or bleeding at the injection site.  You have a fever.  Your pain is not controlled with medicine.  You have redness, swelling, or pain at your wound that is getting worse.  You have fresh bleeding or pus coming from your wound.  The edges of your wound break open.  You develop swollen, tender glands in your throat. SEEK IMMEDIATE MEDICAL CARE IF:   Your face or the area under your jaw becomes swollen.  You have trouble breathing or swallowing.   This information is not intended to replace advice given  to you by your health care provider. Make sure you discuss any questions you have with your health care provider.   Document Released: 08/10/2005 Document Revised: 12/25/2014 Document Reviewed: 08/01/2014 Elsevier Interactive Patient Education [employer]

## 2015-07-22 NOTE — ED Provider Notes (Signed)
CSN: [id number]     Arrival date & time 07/22/15  1044 History   First MD Initiated Contact with Patient 07/22/15 1104     No chief complaint on file.     The history is provided by the patient. No language interpreter was used.   Betty Buchanan is a 64 y.o. female who presents to the Emergency Department complaining of lip laceration. She states she is unsteady on her feet and was walking and fell, striking her lip. This happened just prior to ED arrival. She reports pain in her lip and nose. No loss of consciousness. She does not think she takes no blood thinners. She denies any headache, vomiting, no numbness. No neck pain.   Past Medical History  Diagnosis Date  . Bipolar 1 disorder (HCC)   . Stroke (HCC)   . Hemiplegia (HCC)     L side  . Osteoarthritis   . GERD (gastroesophageal reflux disease)    No past surgical history on file. No family history on file. Social History  Substance Use Topics  . Smoking status: Never Smoker   . Smokeless tobacco: Not on file  . Alcohol Use: No   OB History    No data available     Review of Systems  All other systems reviewed and are negative.     Allergies  Penicillins  Home Medications   Prior to Admission medications   Medication Sig Start Date End Date Taking? Authorizing Provider  aspirin EC 81 MG tablet Take 81 mg by mouth daily with breakfast.    Historical Provider, MD  atorvastatin (LIPITOR) 40 MG tablet Take 40 mg by mouth every evening.    Historical Provider, MD  calcium carbonate (OS-CAL - DOSED IN MG OF ELEMENTAL CALCIUM) 1250 (500 CA) MG tablet Take 1 tablet by mouth daily with breakfast.    Historical Provider, MD  divalproex (DEPAKOTE SPRINKLE) 125 MG capsule Take 500-1,000 mg by mouth 2 (two) times daily. Takes  in the morning and  at night    Historical Provider, MD  LORazepam (ATIVAN) 1 MG tablet Take 1 mg by mouth every 4 (four) hours as needed for anxiety (and agitation).    Historical  Provider, MD  Melatonin 1 MG TABS Take 2 mg by mouth at bedtime.    Historical Provider, MD  Multiple Vitamin (DAILY VITE) TABS Take 1 tablet by mouth daily with breakfast.    Historical Provider, MD  OLANZapine (ZYPREXA) 20 MG tablet Take 20 mg by mouth daily.     Historical Provider, MD  pantoprazole (PROTONIX) 40 MG tablet Take 40 mg by mouth daily with breakfast.    Historical Provider, MD  thiothixene (NAVANE) 5 MG capsule Take 5 mg by mouth 2 (two) times daily.    Historical Provider, MD   BP 115/55 mmHg  Pulse 60  Temp(Src) 98.1 F (36.7 C) (Oral)  Resp 22  SpO2 95% Physical Exam  Constitutional: She is oriented to person, place, and time. She appears well-developed and well-nourished.  HENT:  Head: Normocephalic.  There is avulsion to the left upper lip that does not cross the vermilion border.  Eyes: Pupils are equal, round, and reactive to light.  Neck:  No C-spine tenderness  Cardiovascular: Normal rate and regular rhythm.   No murmur heard. Pulmonary/Chest: Effort normal and breath sounds normal. No respiratory distress.  Abdominal: Soft. There is no tenderness. There is no rebound and no guarding.  Musculoskeletal: She exhibits no edema or tenderness.  Neurological:  She is alert and oriented to person, place, and time.  Mildly confused  Skin: Skin is warm and dry.  Psychiatric: She has a normal mood and affect. Her behavior is normal.  Nursing note and vitals reviewed.   ED Course  Procedures (including critical care time) Labs Review Labs Reviewed - No data to display  Imaging Review No results found. I have personally reviewed and evaluated these images and lab results as part of my medical decision-making.   EKG Interpretation None      MDM   Final diagnoses:  None    Patient here for evaluation of lip laceration following a fall. Patient states she slipped. Pertinent assisted-living they stated that she rolled out of bed and struck her lip.  Patient refuses CT scanning of the head in the emergency department. UA not consistent with UTI. Nursing home states that she has been at her baseline other than she has had more emotional outburst lately. They report no fevers, vomiting, respiratory symptoms. Plan to DC home with close outpatient follow-up with PCP. Return precautions were discussed.    [name], MD 07/22/15 [phone]

## 2015-07-22 NOTE — ED Notes (Signed)
Awake. Verbally responsive. A/O x4. Resp even and unlabored. No audible adventitious breath sounds noted. ABC's intact. No bleeding from lac noted.

## 2015-07-22 NOTE — ED Notes (Signed)
Bed: WA07 Expected date:  Expected time:  Means of arrival:  Comments: EMS- 64yo F, fall/lip lac

## 2015-07-22 NOTE — ED Notes (Signed)
Pt arrived via EMS from [facility] with report of tripping and falling and causing avulsion to lt side of upper lip. Pt denies LOC, dizziness/lightheadedness.

## 2015-09-11 ENCOUNTER — Emergency Department (HOSPITAL_COMMUNITY)
Admission: EM | Admit: 2015-09-11 | Discharge: 2015-09-11 | Disposition: A | Discharge status: Home / Self Care | Payer: Medicare HMO | Attending: Emergency Medicine | Admitting: Emergency Medicine

## 2015-09-11 ENCOUNTER — Encounter (HOSPITAL_COMMUNITY): Payer: Self-pay | Admitting: Emergency Medicine

## 2015-09-11 DIAGNOSIS — F17210 Nicotine dependence, cigarettes, uncomplicated: Secondary | ICD-10-CM | POA: Insufficient documentation

## 2015-09-11 DIAGNOSIS — T494X1A Poisoning by keratolytics, keratoplastics, and other hair treatment drugs and preparations, accidental (unintentional), initial encounter: Secondary | ICD-10-CM | POA: Insufficient documentation

## 2015-09-11 DIAGNOSIS — X58XXXA Exposure to other specified factors, initial encounter: Secondary | ICD-10-CM | POA: Insufficient documentation

## 2015-09-11 DIAGNOSIS — Z7982 Long term (current) use of aspirin: Secondary | ICD-10-CM | POA: Insufficient documentation

## 2015-09-11 DIAGNOSIS — F319 Bipolar disorder, unspecified: Secondary | ICD-10-CM | POA: Insufficient documentation

## 2015-09-11 DIAGNOSIS — L299 Pruritus, unspecified: Secondary | ICD-10-CM | POA: Diagnosis present

## 2015-09-11 DIAGNOSIS — Y998 Other external cause status: Secondary | ICD-10-CM | POA: Insufficient documentation

## 2015-09-11 DIAGNOSIS — Z8673 Personal history of transient ischemic attack (TIA), and cerebral infarction without residual deficits: Secondary | ICD-10-CM | POA: Insufficient documentation

## 2015-09-11 DIAGNOSIS — K219 Gastro-esophageal reflux disease without esophagitis: Secondary | ICD-10-CM | POA: Diagnosis not present

## 2015-09-11 DIAGNOSIS — Y9289 Other specified places as the place of occurrence of the external cause: Secondary | ICD-10-CM | POA: Insufficient documentation

## 2015-09-11 DIAGNOSIS — Z23 Encounter for immunization: Secondary | ICD-10-CM | POA: Insufficient documentation

## 2015-09-11 DIAGNOSIS — Z8669 Personal history of other diseases of the nervous system and sense organs: Secondary | ICD-10-CM | POA: Diagnosis not present

## 2015-09-11 DIAGNOSIS — T65891A Toxic effect of other specified substances, accidental (unintentional), initial encounter: Secondary | ICD-10-CM | POA: Diagnosis not present

## 2015-09-11 DIAGNOSIS — Z79899 Other long term (current) drug therapy: Secondary | ICD-10-CM | POA: Diagnosis not present

## 2015-09-11 DIAGNOSIS — T2045XA Corrosion of unspecified degree of scalp [any part], initial encounter: Secondary | ICD-10-CM | POA: Diagnosis not present

## 2015-09-11 DIAGNOSIS — M1990 Unspecified osteoarthritis, unspecified site: Secondary | ICD-10-CM | POA: Insufficient documentation

## 2015-09-11 DIAGNOSIS — Y9389 Activity, other specified: Secondary | ICD-10-CM | POA: Insufficient documentation

## 2015-09-11 DIAGNOSIS — Z880 Allergy status to penicillin: Secondary | ICD-10-CM | POA: Diagnosis not present

## 2015-09-11 DIAGNOSIS — T304 Corrosion of unspecified body region, unspecified degree: Secondary | ICD-10-CM

## 2015-09-11 MED ORDER — ACETAMINOPHEN 500 MG PO TABS
1000.0000 mg | ORAL_TABLET | Freq: Once | ORAL | Status: AC
  Administered 2015-09-11: 1000 mg via ORAL
  Filled 2015-09-11: qty 2

## 2015-09-11 MED ORDER — BACITRACIN ZINC 500 UNIT/GM EX OINT
TOPICAL_OINTMENT | Freq: Once | CUTANEOUS | Status: AC
  Administered 2015-09-11: 2 via TOPICAL

## 2015-09-11 MED ORDER — BACITRACIN ZINC 500 UNIT/GM EX OINT: 1.0000 "application " | TOPICAL_OINTMENT | Freq: Two times a day (BID) | CUTANEOUS | Status: DC

## 2015-09-11 MED ORDER — ACETAMINOPHEN 500 MG PO TABS: 500.0000 mg | ORAL_TABLET | Freq: Four times a day (QID) | ORAL | Status: DC | PRN

## 2015-09-11 MED ORDER — TETANUS-DIPHTH-ACELL PERTUSSIS 5-2.5-18.5 LF-MCG/0.5 IM SUSP
0.5000 mL | Freq: Once | INTRAMUSCULAR | Status: AC
  Administered 2015-09-11: 0.5 mL via INTRAMUSCULAR
  Filled 2015-09-11: qty 0.5

## 2015-09-11 NOTE — Discharge Instructions (Signed)
Wash the affected area with soap and water and apply a thin layer of topical antibiotic ointment. Do this every 12 hours.  ° °Do not use rubbing alcohol or hydrogen peroxide.                       ° °Look for signs of infection: if you see redness, if the area becomes warm, if pain increases sharply, there is discharge (pus), if red streaks appear or you develop fever or vomiting, RETURN immediately to the Emergency Department  for a recheck.  ° °Do not hesitate to return to the emergency room for any new, worsening or concerning symptoms. ° °Please obtain primary care using resource guide below. Let them know that you were seen in the emergency room and that they will need to obtain records for further outpatient management. ° ° ° °Emergency Department Resource Guide °1) Find a Doctor and Pay Out of Pocket °Although you won't have to find out who is covered by your insurance plan, it is a good idea to ask around and get recommendations. You will then need to call the office and see if the doctor you have chosen will accept you as a new patient and what types of options they offer for patients who are self-pay. Some doctors offer discounts or will set up payment plans for their patients who do not have insurance, but you will need to ask so you aren't surprised when you get to your appointment. ° °2) Contact Your Local Health Department °Not all health departments have doctors that can see patients for sick visits, but many do, so it is worth a call to see if yours does. If you don't know where your local health department is, you can check in your phone book. The CDC also has a tool to help you locate your state's health department, and many state websites also have listings of all of their local health departments. ° °3) Find a Walk-in Clinic °If your illness is not likely to be very severe or complicated, you may want to try a walk in clinic. These are popping up all over the country in pharmacies, drugstores,  and shopping centers. They're usually staffed by nurse practitioners or physician assistants that have been trained to treat common illnesses and complaints. They're usually fairly quick and inexpensive. However, if you have serious medical issues or chronic medical problems, these are probably not your best option. ° °No Primary Care Doctor: °- Call Health Connect at  832-8000 - they can help you locate a primary care doctor that  accepts your insurance, provides certain services, etc. °- Physician Referral Service- 1-800-533-3463 ° °Chronic Pain Problems: °Organization         Address  Phone   Notes  °[facility]  ([phone] Patients need to be referred by their primary care doctor.  ° °Medication Assistance: °Organization         Address  Phone   Notes  °[place] [facility] [address] °[place], [place] [address] [phone]) [phone] --Must be a resident of [place] °-- Must have NO insurance coverage whatsoever (no Medicaid/ Medicare, etc.) °-- The pt. MUST have a primary care doctor that directs their care regularly and follows them in the community °  °MedAssist  (866) 331-1348   °United Way  (888) 892-1162   ° °Agencies that provide inexpensive medical care: °Organization           Address  Phone   Notes  [facility]  [phone]   [facility]    [phone]   [facility] [address] [place], [place] [address] [phone]   [facility] [address] [place], [place] [phone]   Planned Parenthood    [phone]   [facility]    [phone]   [facility]  [address], [place] Phone:  [phone], Fax:  [phone] Hours of Operation:  9 am - 6 pm, M-F.  Also accepts Medicaid/Medicare and self-pay.  [facility]  [address], [place] Phone: [phone], Fax: [phone]. Hours of Operation:  8:30 am - 5:30 pm, M-F.  Also accepts Medicaid and self-pay.  [facility] [place] [address] [place] Phone: [phone]   [facility] [address] Natasha Bence [place], [place] [phone], Ext. [phone] Mondays & Thursdays: 7-9 AM.  First 15 patients are seen on a first come, first serve basis.    Medicaid-accepting [facility] Providers:  Organization         Address  Phone   Notes  [facility] [address], [place] [phone] Also accepts self-pay patients.  [facility] [address] Laurell Josephs [place], [place]  [phone]   [facility] [address], [place] [phone]   [facility] [address] [place] [phone]   Renaye Rakers [address], [place]   [phone] Only accepts [place] patients after they have their name applied to their card.   Self-Pay (no insurance) in [facility]:  Organization         Address  Phone   Notes  Sickle Cell Patients, [facility] [address] [place], [place] [phone]   [facility] [address] [place], [place] [phone]   [facility] [place]  [address], [place] [phone]   [facility]/Dr. Osei-Bonsu  [address] [place] or [address], [place] [phone] Phone number for both [place] and [place] locations is the same.  [facility]gent [facility] and [facility] [address] [place] [phone]   [facility] [address] [place] or [address] [phone]   [facility] [address] [place] [phone], phone; [phone], fax Sees patients 1st and 3rd Saturday of every month.  Must not qualify for public or  private insurance (i.e. Medicaid, Medicare, [place] Health Choice, Veterans' Benefits)  Household income should be no more than 200% of the poverty level The clinic cannot treat you if you are pregnant or think you are pregnant  Sexually transmitted diseases are not treated at the clinic.    Dental Care: Organization         Address  Phone  Notes  [facility] [address] [place], [place] [phone] Accepts children up to age 51 who are enrolled in [place] or [place] Health Choice; pregnant women with a Medicaid card; and children who have applied for Medicaid or  Health Choice, but were declined, whose parents can pay a reduced fee at time  of service.  [facility]  [address], [place] [phone] Accepts children up to age 61 who are enrolled in [place] or [place] Health Choice; pregnant women with a Medicaid card; and children who have applied for Medicaid or [place] Health Choice, but were declined, whose parents can pay a reduced fee at time of service.  [facility]  [address] [place], [place] [phone] Patients are seen by appointment only. Walk-ins are not accepted. [facility] will see patients 53 years of age and older. Monday - Tuesday (8am-5pm) Most Wednesdays (8:30-5pm) $30 per visit, cash only  [facility]  [address], [facility] [phone] Patients are seen by appointment only. Walk-ins are not accepted. [facility] will see patients 66 years of age and older. One Wednesday Evening (Monthly: Volunteer Based).  $30 per visit, cash only  [employer]  [phone] for adults; Children under age 30, call Graduate Pediatric Dentistry at [phone]. Children aged 85-14, please call [phone] to request a pediatric application.  Dental services are provided in all areas of dental  care including fillings, crowns and bridges, complete and partial dentures, implants, gum treatment, root canals, and extractions. Preventive care is also provided. Treatment is provided to both adults and children. Patients are selected via a lottery and there is often a waiting list.   [facility] [address], [place]  [phone] [email or web address]   [facility] [address] [place], [place] [phone], Ext. [phone] Second and Fourth Thursday of each month, opens at 6:30 AM; Clinic ends at 9 AM.  Patients are seen on a first-come first-served basis, and a limited number are seen during each clinic.   [facility]  [address] Ether Griffins [place], [place] [phone]   Eligibility Requirements You must have lived in [place], [place], or [place] counties for at least the last three months.   You cannot be eligible for state or federal sponsored [place], including [employer], [place], or [employer].   You generally cannot be eligible for healthcare insurance through your employer.    How to apply: Eligibility screenings are held every Tuesday and Wednesday afternoon from 1:00 pm until 4:00 pm. You do not need an appointment for the interview!  [facility] [address] [place], [place] [phone]   [facility] Health Department  [phone]   [facility] Health Department  [phone]   [facility] Health Department  [phone]    Behavioral Health Resources in the Community: Intensive Outpatient Programs Organization         Address  Phone  Notes  [facility] [address], [place], [place] [phone]   [facility] [address], [place], [place] [phone]   [facility] [address], [place], [place]  [phone]   [facility] [address],  [employer], [place] [phone] or [phone]    Substance Abuse Resources Organization         Address  Phone  Notes  [facility]  8195055341   [facility]  [phone]   [facility] [place]  [phone]   Floydene Flock  [phone]   Residential & Outpatient Substance Abuse Program  [phone]   Psychological Services Organization         Address  Phone  Notes  [employer] [facility]  [phone]   [facility] Services  [phone]   [facility] [address] [place] [phone] or [phone]    Mobile Crisis Teams Organization         Address  Phone  Notes  [facility], Mobile Crisis Care Unit  [phone]   [facility]  [address] [place], [place] [phone]   Doristine Locks [address] [place] [address] [place] [phone]    Self-Help/Support Groups Organization         Address  Phone             Notes  [facility] [place] - variety of support groups  336- I7437963 Call for more information  Narcotics Anonymous (NA), [facility] [address], [employer] Hope  2 meetings at this location   Statistician         Address  Phone  Notes  [facility] [address],    [place]  [phone]   [facility]  [address], [place] [address], [place], [place] [phone]   [facility] [address] [place], [place] [phone] Admissions: 8am-3pm M-F  [facility] [address]    [place], [place] [phone]   [facility] [address] [place], [place], [place] [phone]   [facility] [address]  [place], [place] [phone]   [facility] - Intensive Outpatient [address] Laurell Josephs [address], [place], [place] [phone]   [facility] ([facility] [address] [place]  [place], [place] [phone] or [phone]   [facility] ([facility]) [address], [place], [place] [phone] Accepts Medicaid  [facility] [place] [address]  [place] [phone] Substance Abuse/Addiction Treatment   [facility] Organization         Address  Phone  Notes  [facility]  2345358843   Angie Fava, PhD [address] Ervin Knack [place], [place]   [phone] or [phone]   [facility] Behavioral   [address] [place], [place] [phone]   [facility] [address], [place], [place] [phone] Insurance/Medicaid/sponsorship through [facility] and Families [address]                                    [place], [place] [phone] Therapy/tele-psych/case  [facility] [address], [place] [phone]    Dr. Lolly Mustache  [phone]   [facility]  United [employer] [facility] Dept. 1) [address] [place] 2) [address] [place] 3)  [address], [place] [phone]  [phone]   [facility] Child Abuse Hotline 929-469-9191 or 5613528226 (After Hours)
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
## 2015-09-11 NOTE — ED Notes (Signed)
Notified facility of d/c.  Per facility no transport available.  PTAR notified.

## 2016-07-15 ENCOUNTER — Encounter (HOSPITAL_COMMUNITY): Payer: Self-pay | Admitting: Nurse Practitioner

## 2016-07-15 ENCOUNTER — Emergency Department (HOSPITAL_COMMUNITY): Payer: Medicare HMO

## 2016-07-15 ENCOUNTER — Inpatient Hospital Stay (HOSPITAL_COMMUNITY)
Admission: EM | Admit: 2016-07-15 | Discharge: 2016-07-23 | DRG: 200 | Disposition: A | Discharge status: Skilled Nursing Facility | Payer: Medicare HMO | Attending: Cardiothoracic Surgery | Admitting: Cardiothoracic Surgery

## 2016-07-15 DIAGNOSIS — F319 Bipolar disorder, unspecified: Secondary | ICD-10-CM | POA: Diagnosis present

## 2016-07-15 DIAGNOSIS — K5900 Constipation, unspecified: Secondary | ICD-10-CM | POA: Diagnosis present

## 2016-07-15 DIAGNOSIS — R339 Retention of urine, unspecified: Secondary | ICD-10-CM | POA: Diagnosis not present

## 2016-07-15 DIAGNOSIS — K229 Disease of esophagus, unspecified: Secondary | ICD-10-CM

## 2016-07-15 DIAGNOSIS — R109 Unspecified abdominal pain: Secondary | ICD-10-CM

## 2016-07-15 DIAGNOSIS — K921 Melena: Secondary | ICD-10-CM | POA: Diagnosis present

## 2016-07-15 DIAGNOSIS — R079 Chest pain, unspecified: Secondary | ICD-10-CM

## 2016-07-15 DIAGNOSIS — Z809 Family history of malignant neoplasm, unspecified: Secondary | ICD-10-CM

## 2016-07-15 DIAGNOSIS — F17210 Nicotine dependence, cigarettes, uncomplicated: Secondary | ICD-10-CM | POA: Diagnosis present

## 2016-07-15 DIAGNOSIS — T797XXA Traumatic subcutaneous emphysema, initial encounter: Principal | ICD-10-CM | POA: Diagnosis present

## 2016-07-15 DIAGNOSIS — K920 Hematemesis: Secondary | ICD-10-CM | POA: Diagnosis not present

## 2016-07-15 DIAGNOSIS — J982 Interstitial emphysema: Secondary | ICD-10-CM | POA: Diagnosis present

## 2016-07-15 DIAGNOSIS — I69354 Hemiplegia and hemiparesis following cerebral infarction affecting left non-dominant side: Secondary | ICD-10-CM

## 2016-07-15 DIAGNOSIS — Z7982 Long term (current) use of aspirin: Secondary | ICD-10-CM

## 2016-07-15 DIAGNOSIS — M1990 Unspecified osteoarthritis, unspecified site: Secondary | ICD-10-CM | POA: Diagnosis present

## 2016-07-15 DIAGNOSIS — K219 Gastro-esophageal reflux disease without esophagitis: Secondary | ICD-10-CM | POA: Diagnosis present

## 2016-07-15 LAB — CBC WITH DIFFERENTIAL/PLATELET
BASOS ABS: 0 10*3/uL (ref 0.0–0.1)
Basophils Relative: 0 %
EOS PCT: 0 %
Eosinophils Absolute: 0 10*3/uL (ref 0.0–0.7)
HCT: 33.8 % — ABNORMAL LOW (ref 36.0–46.0)
Hemoglobin: 10.9 g/dL — ABNORMAL LOW (ref 12.0–15.0)
LYMPHS PCT: 19 %
Lymphs Abs: 1.9 10*3/uL (ref 0.7–4.0)
MCH: 27.5 pg (ref 26.0–34.0)
MCHC: 32.2 g/dL (ref 30.0–36.0)
MCV: 85.1 fL (ref 78.0–100.0)
MONO ABS: 1.1 10*3/uL — AB (ref 0.1–1.0)
Monocytes Relative: 11 %
Neutro Abs: 7.3 10*3/uL (ref 1.7–7.7)
Neutrophils Relative %: 70 %
PLATELETS: 223 10*3/uL (ref 150–400)
RBC: 3.97 MIL/uL (ref 3.87–5.11)
RDW: 18.6 % — AB (ref 11.5–15.5)
WBC: 10.3 10*3/uL (ref 4.0–10.5)

## 2016-07-15 LAB — LIPASE, BLOOD: Lipase: 18 U/L (ref 11–51)

## 2016-07-15 LAB — COMPREHENSIVE METABOLIC PANEL
ALT: 19 U/L (ref 14–54)
ANION GAP: 8 (ref 5–15)
AST: 25 U/L (ref 15–41)
Albumin: 3.8 g/dL (ref 3.5–5.0)
Alkaline Phosphatase: 75 U/L (ref 38–126)
BUN: 19 mg/dL (ref 6–20)
CALCIUM: 9.4 mg/dL (ref 8.9–10.3)
CHLORIDE: 102 mmol/L (ref 101–111)
CO2: 27 mmol/L (ref 22–32)
Creatinine, Ser: 0.93 mg/dL (ref 0.44–1.00)
Glucose, Bld: 120 mg/dL — ABNORMAL HIGH (ref 65–99)
POTASSIUM: 4.5 mmol/L (ref 3.5–5.1)
Sodium: 137 mmol/L (ref 135–145)
TOTAL PROTEIN: 7.8 g/dL (ref 6.5–8.1)
Total Bilirubin: 0.2 mg/dL — ABNORMAL LOW (ref 0.3–1.2)

## 2016-07-15 LAB — POC OCCULT BLOOD, ED: FECAL OCCULT BLD: POSITIVE — AB

## 2016-07-15 LAB — URINE MICROSCOPIC-ADD ON
RBC / HPF: NONE SEEN RBC/hpf (ref 0–5)
Squamous Epithelial / LPF: NONE SEEN

## 2016-07-15 LAB — I-STAT CG4 LACTIC ACID, ED
Lactic Acid, Venous: 2.38 mmol/L (ref 0.5–1.9)
Lactic Acid, Venous: 1.11 mmol/L (ref 0.5–1.9)

## 2016-07-15 LAB — URINALYSIS, ROUTINE W REFLEX MICROSCOPIC
Bilirubin Urine: NEGATIVE
GLUCOSE, UA: NEGATIVE mg/dL
Ketones, ur: NEGATIVE mg/dL
LEUKOCYTES UA: NEGATIVE
Nitrite: NEGATIVE
PH: 8 (ref 5.0–8.0)
PROTEIN: NEGATIVE mg/dL
SPECIFIC GRAVITY, URINE: 1.015 (ref 1.005–1.030)

## 2016-07-15 MED ORDER — LEVOFLOXACIN IN D5W 750 MG/150ML IV SOLN
750.0000 mg | INTRAVENOUS | Status: DC
  Administered 2016-07-16 – 2016-07-18 (×3): 750 mg via INTRAVENOUS
  Filled 2016-07-15 (×4): qty 150

## 2016-07-15 MED ORDER — VANCOMYCIN HCL IN DEXTROSE 1-5 GM/200ML-% IV SOLN: 1000.0000 mg | Freq: Once | INTRAVENOUS | Status: DC

## 2016-07-15 MED ORDER — LEVOFLOXACIN IN D5W 750 MG/150ML IV SOLN
750.0000 mg | Freq: Once | INTRAVENOUS | Status: AC
  Administered 2016-07-15: 750 mg via INTRAVENOUS
  Filled 2016-07-15: qty 150

## 2016-07-15 MED ORDER — SODIUM CHLORIDE 0.9 % IJ SOLN
INTRAMUSCULAR | Status: AC
  Filled 2016-07-15: qty 50

## 2016-07-15 MED ORDER — IOPAMIDOL (ISOVUE-300) INJECTION 61%
INTRAVENOUS | Status: AC
  Filled 2016-07-15: qty 30

## 2016-07-15 MED ORDER — SODIUM CHLORIDE 0.9 % IV BOLUS (SEPSIS)
1000.0000 mL | Freq: Once | INTRAVENOUS | Status: AC
  Administered 2016-07-15: 1000 mL via INTRAVENOUS

## 2016-07-15 MED ORDER — DEXTROSE 5 % IV SOLN
2.0000 g | Freq: Once | INTRAVENOUS | Status: AC
  Administered 2016-07-15: 2 g via INTRAVENOUS
  Filled 2016-07-15: qty 2

## 2016-07-15 MED ORDER — ONDANSETRON HCL 4 MG/2ML IJ SOLN
4.0000 mg | Freq: Once | INTRAMUSCULAR | Status: AC
  Administered 2016-07-15: 4 mg via INTRAVENOUS
  Filled 2016-07-15: qty 2

## 2016-07-15 MED ORDER — IOPAMIDOL (ISOVUE-300) INJECTION 61%
INTRAVENOUS | Status: AC
  Filled 2016-07-15: qty 100

## 2016-07-15 MED ORDER — VANCOMYCIN HCL 10 G IV SOLR
1250.0000 mg | Freq: Two times a day (BID) | INTRAVENOUS | Status: DC
  Administered 2016-07-16 – 2016-07-17 (×3): 1250 mg via INTRAVENOUS
  Filled 2016-07-15 (×4): qty 1250

## 2016-07-15 MED ORDER — IOPAMIDOL (ISOVUE-300) INJECTION 61%
100.0000 mL | Freq: Once | INTRAVENOUS | Status: AC | PRN
  Administered 2016-07-15: 100 mL via INTRAVENOUS

## 2016-07-15 MED ORDER — DEXTROSE 5 % IV SOLN
2.0000 g | Freq: Three times a day (TID) | INTRAVENOUS | Status: DC
  Administered 2016-07-16 – 2016-07-17 (×5): 2 g via INTRAVENOUS
  Filled 2016-07-15 (×6): qty 2

## 2016-07-15 MED ORDER — VANCOMYCIN HCL IN DEXTROSE 1-5 GM/200ML-% IV SOLN
1000.0000 mg | Freq: Once | INTRAVENOUS | Status: AC
  Administered 2016-07-15: 1000 mg via INTRAVENOUS
  Filled 2016-07-15: qty 200

## 2016-07-15 NOTE — ED Notes (Signed)
Writer notified EDP Tegler of abnormal I-stat lactic result

## 2016-07-15 NOTE — Progress Notes (Signed)
Pharmacy Antibiotic Note  Betty Buchanan is a 65 y.o. female admitted on 07/15/2016 with sepsis.  Pharmacy has been consulted for aztreonam, levaquin and vancomycin dosing.    Plan: Aztreonam 2gm IV q8h levaquin 750mg  IV q24h Vancomycin 2gm IV in ED then start 1250mg  IV q12h  Height: 5\' 6"  (167.6 cm) Weight: 213 lb (96.6 kg) IBW/kg (Calculated) : 59.3  Temp (24hrs), Avg:98.9 F (37.2 C), Min:98.9 F (37.2 C), Max:98.9 F (37.2 C)   Recent Labs Lab 07/15/16 1921 07/15/16 1926  WBC 10.3  --   CREATININE 0.93  --   LATICACIDVEN  --  2.38*    Estimated Creatinine Clearance: 71.6 mL/min (by C-G formula based on SCr of 0.93 mg/dL).    Allergies  Allergen Reactions  . Penicillins     Unknown reaction Has patient had a PCN reaction causing immediate rash, facial/tongue/throat swelling, SOB or lightheadedness with hypotension:Unknown Has patient had a PCN reaction causing severe rash involving mucus membranes or skin necrosis:Unknown Has patient had a PCN reaction that required hospitalization:Unknown Has patient had a PCN reaction occurring within the last 10 years:Unknown If all of the above answers are "NO", then may proceed with Cephalosporin use.     Antimicrobials this admission: 11/22 aztronam >> 11/22 levaquin >> 11/22 vancomycin >>   Microbiology results: 11/22 BCx: sent 11/22 UCx: sent  Thank you for allowing pharmacy to be a part of this patient's care.  Arley Phenixllen Ebonique Hallstrom RPh 07/15/2016, 8:47 PM Pager [phone]

## 2016-07-15 NOTE — ED Notes (Addendum)
Pt. DG chest 2 view documented in error.

## 2016-07-15 NOTE — ED Triage Notes (Signed)
Pt has been sent from [facility] for evaluation; reportedly has been vomiting since noon today, also suspicion for constipation  And low abdominal pain.

## 2016-07-15 NOTE — ED Provider Notes (Signed)
[facility]-EMERGENCY DEPT Provider Note   CSN: [id number] Arrival date & time: 07/15/16  1736     History   Chief Complaint Chief Complaint  Patient presents with  . Abdominal Pain  . Emesis    HPI Betty Buchanan is a 65 y.o. female with a past medical history significant for bipolar disorder, prior stroke with left-sided hemiplegia, GERD, pancreatitis, and osteoarthritis who presents with nausea, vomiting, abdominal pain, rectal bleeding, constipation, and chills. Patient reports that starting today at noon, patient had onset of severe abdominal pain. She says that she had pain in her central and right abdomen. She describes the pain as a 9 out of 10 severity initially but it has decreased to a 1 out of 10. She says that it is dull in quality. She reports no episodes of blood in her vomit but did have one episode of emesis earlier. She reports continuous nausea. She reports no radiation of her abdominal pain. She describes 3 bowel movements that had dark blood within. She denies any history of rectal bleeding in the past. She does report some constipation. She denies any diarrhea or changes in urination. She denies any fevers, shortness of breath, cough, or chest pain.  The history is provided by the patient and medical records. No language interpreter was used.  Abdominal Pain   This is a new problem. The current episode started 6 to 12 hours ago. The problem occurs constantly. The problem has been gradually improving. The pain is located in the RLQ and periumbilical region. The quality of the pain is aching, dull and cramping. The pain is at a severity of 9/10. The pain is severe. Associated symptoms include nausea, vomiting and constipation. Pertinent negatives include fever, dysuria, frequency and headaches. The symptoms are aggravated by palpation. Nothing relieves the symptoms.    Past Medical History:  Diagnosis Date  . Bipolar 1 disorder (HCC)   . GERD (gastroesophageal reflux disease)    . Hemiplegia (HCC)    L side  . Osteoarthritis   . Stroke [facility])     There are no active problems to display for this patient.   Past Surgical History:  Procedure Laterality Date  . KNEE ARTHROSCOPY      OB History    No data available       Home Medications    Prior to Admission medications   Medication Sig Start Date End Date Taking? Authorizing Provider  acetaminophen (TYLENOL) 500 MG tablet Take 1 tablet (500 mg total) by mouth every 6 (six) hours as needed. 09/11/15  Yes Nicole Pisciotta, PA-C  atorvastatin (LIPITOR) 40 MG tablet Take 40 mg by mouth every evening.   Yes Historical Provider, MD  divalproex (DEPAKOTE SPRINKLE) 125 MG capsule Take 500-1,000 mg by mouth 2 (two) times daily. Takes 500mg  in the morning and 1000mg  at night   Yes Historical Provider, MD  LORazepam (ATIVAN) 0.5 MG tablet Take 0.5 mg by mouth daily.   Yes Historical Provider, MD  Melatonin 1 MG TABS Take 2 mg by mouth at bedtime.   Yes Historical Provider, MD  OLANZapine (ZYPREXA) 10 MG tablet Take 10 mg by mouth at bedtime. Take with 30 mg to get 40 mg dose   Yes Historical Provider, MD  OLANZapine (ZYPREXA) 15 MG tablet Take 30 mg by mouth at bedtime. Take with 10 mg dose to get 40 mg total   Yes Historical Provider, MD  pantoprazole (PROTONIX) 40 MG tablet Take 40 mg by mouth daily with breakfast.  Yes Historical Provider, MD  aspirin EC 81 MG tablet Take 81 mg by mouth daily with breakfast.    Historical Provider, MD  bacitracin ointment Apply 1 application topically 2 (two) times daily. 09/11/15   Nicole Pisciotta, PA-C  calcium carbonate (OS-CAL - DOSED IN MG OF ELEMENTAL CALCIUM) 1250 (500 CA) MG tablet Take 1 tablet by mouth daily with breakfast.    Historical Provider, MD  LORazepam (ATIVAN) 1 MG tablet Take 1 mg by mouth every 4 (four) hours as needed for anxiety (and agitation).    Historical Provider, MD  Multiple Vitamin (DAILY VITE) TABS Take 1 tablet by mouth daily with breakfast.     Historical Provider, MD  OLANZapine (ZYPREXA) 20 MG tablet Take 20 mg by mouth daily with breakfast.     Historical Provider, MD  thiothixene (NAVANE) 5 MG capsule Take 5 mg by mouth 2 (two) times daily.    Historical Provider, MD    Family History Family History  Problem Relation Age of Onset  . Cancer Father   . Stroke Father     Social History Social History  Substance Use Topics  . Smoking status: Current Every Day Smoker    Types: Cigarettes  . Smokeless tobacco: Not on file  . Alcohol use No     Allergies   Penicillins   Review of Systems Review of Systems  Constitutional: Positive for chills and fatigue. Negative for diaphoresis and fever.  Eyes: Negative for visual disturbance.  Respiratory: Negative for chest tightness, shortness of breath, wheezing and stridor.   Cardiovascular: Negative for chest pain, palpitations and leg swelling.  Gastrointestinal: Positive for abdominal pain, blood in stool, constipation, nausea and vomiting.  Genitourinary: Negative for dysuria, flank pain and frequency.  Musculoskeletal: Negative for back pain, neck pain and neck stiffness.  Skin: Negative for rash and wound.  Neurological: Positive for weakness (at baseline). Negative for numbness and headaches.  Psychiatric/Behavioral: Negative for agitation.  All other systems reviewed and are negative.    Physical Exam Updated Vital Signs BP 125/92 (BP Location: Left Arm)   Pulse 110   Temp 98.9 F (37.2 C) (Oral)   Resp 18   SpO2 95%   Physical Exam  Constitutional: She is oriented to person, place, and time. She appears well-developed and well-nourished. No distress.  HENT:  Head: Normocephalic and atraumatic.  Mouth/Throat: Oropharynx is clear and moist. No oropharyngeal exudate.  Eyes: Conjunctivae and EOM are normal. Pupils are equal, round, and reactive to light.  Neck: Normal range of motion. Neck supple.  Cardiovascular: Normal rate, regular rhythm and intact  distal pulses.   No murmur heard. Pulmonary/Chest: Effort normal and breath sounds normal. No stridor. No respiratory distress. She has no wheezes. She exhibits no tenderness.  Abdominal: Soft. Normal appearance. There is generalized tenderness and tenderness in the right lower quadrant. There is no rigidity and no CVA tenderness.  Genitourinary: Rectal exam shows guaiac positive stool.  Musculoskeletal: She exhibits no edema, tenderness or deformity.  Neurological: She is alert and oriented to person, place, and time. She is not disoriented. No cranial nerve deficit or sensory deficit. She exhibits abnormal muscle tone. Coordination normal. GCS eye subscore is 4. GCS verbal subscore is 5. GCS motor subscore is 6.  Left arm and left leg weakness compared to right. Patient reports this is at her baseline.    Skin: Skin is warm and dry. Capillary refill takes less than 2 seconds. No rash noted. No erythema.  Psychiatric: She  has a normal mood and affect.  Nursing note and vitals reviewed.    ED Treatments / Results  Labs (all labs ordered are listed, but only abnormal results are displayed) Labs Reviewed  CBC WITH DIFFERENTIAL/PLATELET - Abnormal; Notable for the following:       Result Value   Hemoglobin 10.9 (*)    HCT 33.8 (*)    RDW 18.6 (*)    Monocytes Absolute 1.1 (*)    All other components within normal limits  COMPREHENSIVE METABOLIC PANEL - Abnormal; Notable for the following:    Glucose, Bld 120 (*)    Total Bilirubin 0.2 (*)    All other components within normal limits  URINALYSIS, ROUTINE W REFLEX MICROSCOPIC (NOT [facility]) - Abnormal; Notable for the following:    APPearance CLOUDY (*)    Hgb urine dipstick LARGE (*)    All other components within normal limits  URINE MICROSCOPIC-ADD ON - Abnormal; Notable for the following:    Bacteria, UA RARE (*)    All other components within normal limits  POC OCCULT BLOOD, ED - Abnormal; Notable for the following:    Fecal  Occult Bld POSITIVE (*)    All other components within normal limits  I-STAT CG4 LACTIC ACID, ED - Abnormal; Notable for the following:    Lactic Acid, Venous 2.38 (*)    All other components within normal limits  CULTURE, BLOOD (ROUTINE X 2)  CULTURE, BLOOD (ROUTINE X 2)  URINE CULTURE  LIPASE, BLOOD  I-STAT CG4 LACTIC ACID, ED    EKG  EKG Interpretation None       Radiology Dg Chest 2 View  Result Date: 07/15/2016 CLINICAL DATA:  Right lower quadrant pain. EXAM: CHEST  2 VIEW COMPARISON:  None. FINDINGS: Pneumomediastinum seen along the right heart border. Gas is seen in the right supraclavicular fossa. Probable Subdiaphragmatic gas on the right. Normal heart size. No consolidation or edema. No definite pneumothorax. Critical Value/emergent results were called by telephone at the time of interpretation on 07/15/2016 at 7:46 pm to Dr. Lynden OxfordHRISTOPHER Meghan Tiemann , who verbally acknowledged these results. IMPRESSION: Pneumomediastinum and probable pneumoperitoneum. Soft tissue emphysema in the supraclavicular fossa.Recommend CT to determine the source. Electronically Signed   By: Marnee SpringJonathon  Watts M.D.   On: 07/15/2016 19:47   Ct Chest Wo Contrast  Result Date: 07/15/2016 CLINICAL DATA:  Pneumomediastinum, concern for perforation EXAM: CT CHEST WITHOUT CONTRAST TECHNIQUE: Multidetector CT imaging of the chest was performed following the standard protocol without IV contrast. COMPARISON:  CT 07/15/2016, radiograph 07/15/2016 FINDINGS: Cardiovascular: Limited without intravenous contrast. Minimal calcifications in the aorta. No pericardial effusion. Mediastinum/Nodes: No significantly enlarged lymph nodes. There is a small amount of intraluminal contrast present within the esophagus. There is no extraluminal contrast present within the mediastinum. There is contrast within the stomach without evidence for extraluminal contrast within the upper abdomen.Extensive pneumo mediastinum again visualized  from the neck with gas collections present in the supraclavicular fossa on the right. Moderate amount of pneumomediastinum surrounding the great vessels, trachea, and esophagus. Extension of pneumomediastinum through the diaphragmatic hiatus. Partially visualized pockets of pneumoperitoneum as before. Lungs/Pleura: There is no evidence for pleural effusion or consolidation. No significant pneumothorax. Few scattered lung blebs. Upper Abdomen: Small pockets of pneumoperitoneum are visualized. There is contrast present within the stomach. Musculoskeletal: No suspicious bone lesions. IMPRESSION: 1. Small amount of intraluminal contrast present within the esophagus. No extravasation of contrast/extraluminal contrast present within the mediastinum. Contrast is also present within the stomach  and there is no extraluminal contrast visualized in the upper abdomen. There is no significant pleural effusion. 2. Extensive pneumomediastinum again visualized. Small amount of pneumoperitoneum partially visualized. Electronically Signed   By: Jasmine PangKim  Fujinaga M.D.   On: 07/15/2016 23:31   Ct Chest W Contrast  Result Date: 07/15/2016 CLINICAL DATA:  Vomiting since noon today. Constipation and low abdominal pain. Chest radiograph showed evidence of pneumo mediastinum. EXAM: CT CHEST, ABDOMEN, AND PELVIS WITH CONTRAST TECHNIQUE: Multidetector CT imaging of the chest, abdomen and pelvis was performed following the standard protocol during bolus administration of intravenous contrast. CONTRAST:  100mL ISOVUE-300 IOPAMIDOL (ISOVUE-300) INJECTION 61% COMPARISON:  Chest radiograph 07/15/2016 FINDINGS: CT CHEST FINDINGS Cardiovascular: No significant vascular findings. Normal heart size. No pericardial effusion. Mediastinum/Nodes: There is extensive pneumo mediastinum with gas demonstrated in the anterior and middle mediastinal compartments common tracking along the esophagus, and with subcutaneous gas in the base of the neck and right  supraclavicular region. The esophagus is decompressed. No significant lymphadenopathy in the chest. Lungs/Pleura: Evaluation of lungs is limited due to respiratory motion artifact. No pneumothorax. Atelectasis in the lung bases. No focal consolidation. Airways are patent. No pleural effusions. Musculoskeletal: Degenerative changes in the thoracic spine. No acute displaced fractures are demonstrated. No destructive bone lesions. CT ABDOMEN PELVIS FINDINGS Hepatobiliary: No focal liver abnormality is seen. No gallstones, gallbladder wall thickening, or biliary dilatation. Pancreas: Unremarkable. No pancreatic ductal dilatation or surrounding inflammatory changes. Spleen: Normal in size without focal abnormality. Adrenals/Urinary Tract: Adrenal glands are unremarkable. Kidneys are normal, without renal calculi, focal lesion, or hydronephrosis. Bladder is unremarkable. Stomach/Bowel: Stomach, small bowel, and colon are not abnormally distended. Small bowel are mostly decompressed. Colon is diffusely stool-filled consistent with history of constipation. No wall thickening is appreciated. Appendix is normal. Vascular/Lymphatic: Aortic atherosclerosis. No enlarged abdominal or pelvic lymph nodes. Reproductive: Uterus and bilateral adnexa are unremarkable. Other: There is diffuse free air demonstrated in the abdomen and pelvis mostly in the retroperitoneal spaces. Gas collection surround of the rectosigmoid colon, para-aortic and pericaval spaces, bilateral para renal spaces and renal sinus, with extension of into the subdiaphragmatic spaces bilaterally. Mesenteric gas is also present centrally. No free intraperitoneal air. No free fluid in the abdomen or pelvis. Abdominal wall musculature appears intact. Musculoskeletal: Degenerative changes in the lumbar spine. No acute displaced fractures or focal destructive bone lesions appreciated. IMPRESSION: Extraluminal gas is demonstrated extending from the base of the neck/right  supraclavicular region, throughout the mediastinum, throughout the abdominal and pelvic retroperitoneal spaces, in the bilateral subdiaphragmatic spaces and in the central mesentery. No free fluid. Given the history of vomiting, this may arise from esophageal rupture consistent with Boerhaave disease. Perforation of trachea/alveoli or other retroperitoneal hollow viscus is not excluded. Specific site of perforation is not indicated. These results were called by telephone at the time of interpretation on 07/15/2016 at 9:40 pm to Dr. Lynden OxfordHRISTOPHER Heath Badon , who verbally acknowledged these results. Electronically Signed   By: Burman NievesWilliam  Stevens M.D.   On: 07/15/2016 21:46   Ct Abdomen Pelvis W Contrast  Result Date: 07/15/2016 CLINICAL DATA:  Vomiting since noon today. Constipation and low abdominal pain. Chest radiograph showed evidence of pneumo mediastinum. EXAM: CT CHEST, ABDOMEN, AND PELVIS WITH CONTRAST TECHNIQUE: Multidetector CT imaging of the chest, abdomen and pelvis was performed following the standard protocol during bolus administration of intravenous contrast. CONTRAST:  100mL ISOVUE-300 IOPAMIDOL (ISOVUE-300) INJECTION 61% COMPARISON:  Chest radiograph 07/15/2016 FINDINGS: CT CHEST FINDINGS Cardiovascular: No significant vascular findings. Normal  heart size. No pericardial effusion. Mediastinum/Nodes: There is extensive pneumo mediastinum with gas demonstrated in the anterior and middle mediastinal compartments common tracking along the esophagus, and with subcutaneous gas in the base of the neck and right supraclavicular region. The esophagus is decompressed. No significant lymphadenopathy in the chest. Lungs/Pleura: Evaluation of lungs is limited due to respiratory motion artifact. No pneumothorax. Atelectasis in the lung bases. No focal consolidation. Airways are patent. No pleural effusions. Musculoskeletal: Degenerative changes in the thoracic spine. No acute displaced fractures are demonstrated.  No destructive bone lesions. CT ABDOMEN PELVIS FINDINGS Hepatobiliary: No focal liver abnormality is seen. No gallstones, gallbladder wall thickening, or biliary dilatation. Pancreas: Unremarkable. No pancreatic ductal dilatation or surrounding inflammatory changes. Spleen: Normal in size without focal abnormality. Adrenals/Urinary Tract: Adrenal glands are unremarkable. Kidneys are normal, without renal calculi, focal lesion, or hydronephrosis. Bladder is unremarkable. Stomach/Bowel: Stomach, small bowel, and colon are not abnormally distended. Small bowel are mostly decompressed. Colon is diffusely stool-filled consistent with history of constipation. No wall thickening is appreciated. Appendix is normal. Vascular/Lymphatic: Aortic atherosclerosis. No enlarged abdominal or pelvic lymph nodes. Reproductive: Uterus and bilateral adnexa are unremarkable. Other: There is diffuse free air demonstrated in the abdomen and pelvis mostly in the retroperitoneal spaces. Gas collection surround of the rectosigmoid colon, para-aortic and pericaval spaces, bilateral para renal spaces and renal sinus, with extension of into the subdiaphragmatic spaces bilaterally. Mesenteric gas is also present centrally. No free intraperitoneal air. No free fluid in the abdomen or pelvis. Abdominal wall musculature appears intact. Musculoskeletal: Degenerative changes in the lumbar spine. No acute displaced fractures or focal destructive bone lesions appreciated. IMPRESSION: Extraluminal gas is demonstrated extending from the base of the neck/right supraclavicular region, throughout the mediastinum, throughout the abdominal and pelvic retroperitoneal spaces, in the bilateral subdiaphragmatic spaces and in the central mesentery. No free fluid. Given the history of vomiting, this may arise from esophageal rupture consistent with Boerhaave disease. Perforation of trachea/alveoli or other retroperitoneal hollow viscus is not excluded. Specific site  of perforation is not indicated. These results were called by telephone at the time of interpretation on 07/15/2016 at 9:40 pm to Dr. Lynden Oxford , who verbally acknowledged these results. Electronically Signed   By: Burman Nieves M.D.   On: 07/15/2016 21:46    Procedures Procedures (including critical care time)  CRITICAL CARE Performed by: Canary Brim Latoria Dry Total critical care time: 30 minutes Critical care time was exclusive of separately billable procedures and treating other patients. Critical care was necessary to treat or prevent imminent or life-threatening deterioration. Critical care was time spent personally by me on the following activities: development of treatment plan with patient and/or surrogate as well as nursing, discussions with consultants, evaluation of patient's response to treatment, examination of patient, obtaining history from patient or surrogate, ordering and performing treatments and interventions, ordering and review of laboratory studies, ordering and review of radiographic studies, pulse oximetry and re-evaluation of patient's condition.   Medications Ordered in ED Medications  iopamidol (ISOVUE-300) 61 % injection (not administered)  sodium chloride 0.9 % injection (not administered)  vancomycin (VANCOCIN) IVPB 1000 mg/200 mL premix (0 mg Intravenous Hold 07/15/16 2128)  vancomycin (VANCOCIN) 1,250 mg in sodium chloride 0.9 % 250 mL IVPB (not administered)  aztreonam (AZACTAM) 2 g in dextrose 5 % 50 mL IVPB (not administered)  levofloxacin (LEVAQUIN) IVPB 750 mg (not administered)  iopamidol (ISOVUE-300) 61 % injection (not administered)  sodium chloride 0.9 % bolus 1,000 mL (0 mLs  Intravenous Stopped 07/15/16 2233)  ondansetron (ZOFRAN) injection 4 mg (4 mg Intravenous Given 07/15/16 1951)  sodium chloride 0.9 % bolus 1,000 mL (0 mLs Intravenous Stopped 07/15/16 2233)    And  sodium chloride 0.9 % bolus 1,000 mL (1,000 mLs Intravenous New  Bag/Given 07/15/16 2227)  levofloxacin (LEVAQUIN) IVPB 750 mg (0 mg Intravenous Stopped 07/15/16 2231)  aztreonam (AZACTAM) 2 g in dextrose 5 % 50 mL IVPB (0 g Intravenous Stopped 07/15/16 2231)  vancomycin (VANCOCIN) IVPB 1000 mg/200 mL premix (0 mg Intravenous Stopped 07/15/16 2231)  iopamidol (ISOVUE-300) 61 % injection 100 mL (100 mLs Intravenous Contrast Given 07/15/16 2056)     Initial Impression / Assessment and Plan / ED Course  I have reviewed the triage vital signs and the nursing notes.  Pertinent labs & imaging results that were available during my care of the patient were reviewed by me and considered in my medical decision making (see chart for details).  Clinical Course     Areana Kosanke is a 65 y.o. female with a past medical history significant for bipolar disorder, prior stroke with left-sided hemiplegia, GERD, pancreatitis, and osteoarthritis who presents with nausea, vomiting, abdominal pain, rectal bleeding, constipation, and chills.  History and exam are seen above. On exam, patient has severe abdominal tenderness. Patient's lungs are clear. Patient has some left-sided weakness which she reports is at baseline. She has some crepitance in her neck. Rectal exam was performed and gross blood was present. Fecal occult was positive. No rectal hemorrhage was appreciated. No external hemorrhoids seen.  Given patient's tachycardia and symptoms, chest x-ray was obtained. X-ray showed concern for pneumomediastinum. Chest CT was added to ordered CT of the abdomen and pelvis. Laboratory testing was ordered.  Diagnostic workup results revealed elevated lactic acid of 2.3. Code sepsis was initiated and broad-spectrum antibiotics were ordered. Other laboratory testing showed no elevation in white blood cell count and mild anemia of 10.9. No evidence of urinary tract infection. Lipase not elevated.  Nausea improved with Zofran. Fluids were given and cultures were obtained.  CT  scanning revealed concern for extraluminal air in neck, mediastinum, and in the retroperitoneum all the way to the rectum. Initially, verbal report revealed concern for intra-peritoneal air and general surgery was called. After further review of report, there does not appear to be significant intraperitoneal air thus, general surgery recommended CT surgery be called to discuss possible Boerhaave in the setting of nausea and vomiting with her findings. CT surgery recommended repeat CT scan with oral contrast.  This exam was ordered. Patient had resolution and lactic acidosis with fluids and antibiotics.   Repeat CT scan showed no extravasation of oral contrast. CT surgery was called again and they will see the patient. Next  CT surgery evaluated the patient and they will admit her to [facility] for ICU management.    Final Clinical Impressions(s) / ED Diagnoses   Final diagnoses:  Abdominal pain  Chest pain  Abdominal pain  Chest pain  Pneumomediastinum (HCC)  Esophageal abnormality     Clinical Impression: 1. Abdominal pain   2. Chest pain   3. Abdominal pain   4. Chest pain   5. Pneumomediastinum (HCC)   6. Esophageal abnormality     Disposition: Admit to CT surgery    Heide Scales, MD 07/16/16 1022

## 2016-07-16 ENCOUNTER — Inpatient Hospital Stay (HOSPITAL_COMMUNITY): Payer: Medicare HMO

## 2016-07-16 ENCOUNTER — Encounter (HOSPITAL_COMMUNITY): Payer: Self-pay | Admitting: Cardiothoracic Surgery

## 2016-07-16 DIAGNOSIS — K219 Gastro-esophageal reflux disease without esophagitis: Secondary | ICD-10-CM | POA: Diagnosis present

## 2016-07-16 DIAGNOSIS — R339 Retention of urine, unspecified: Secondary | ICD-10-CM | POA: Diagnosis not present

## 2016-07-16 DIAGNOSIS — K5900 Constipation, unspecified: Secondary | ICD-10-CM | POA: Diagnosis present

## 2016-07-16 DIAGNOSIS — J982 Interstitial emphysema: Secondary | ICD-10-CM

## 2016-07-16 DIAGNOSIS — Z809 Family history of malignant neoplasm, unspecified: Secondary | ICD-10-CM | POA: Diagnosis not present

## 2016-07-16 DIAGNOSIS — M1990 Unspecified osteoarthritis, unspecified site: Secondary | ICD-10-CM | POA: Diagnosis present

## 2016-07-16 DIAGNOSIS — K920 Hematemesis: Secondary | ICD-10-CM | POA: Diagnosis present

## 2016-07-16 DIAGNOSIS — F17210 Nicotine dependence, cigarettes, uncomplicated: Secondary | ICD-10-CM | POA: Diagnosis present

## 2016-07-16 DIAGNOSIS — K921 Melena: Secondary | ICD-10-CM | POA: Diagnosis present

## 2016-07-16 DIAGNOSIS — I69354 Hemiplegia and hemiparesis following cerebral infarction affecting left non-dominant side: Secondary | ICD-10-CM | POA: Diagnosis not present

## 2016-07-16 DIAGNOSIS — T797XXA Traumatic subcutaneous emphysema, initial encounter: Secondary | ICD-10-CM | POA: Diagnosis present

## 2016-07-16 DIAGNOSIS — F319 Bipolar disorder, unspecified: Secondary | ICD-10-CM | POA: Diagnosis present

## 2016-07-16 DIAGNOSIS — Z7982 Long term (current) use of aspirin: Secondary | ICD-10-CM | POA: Diagnosis not present

## 2016-07-16 LAB — POCT I-STAT 3, ART BLOOD GAS (G3+)
Acid-base deficit: 1 mmol/L (ref 0.0–2.0)
Bicarbonate: 23.4 mmol/L (ref 20.0–28.0)
O2 Saturation: 90 %
Patient temperature: 98.9
TCO2: 25 mmol/L (ref 0–100)
pCO2 arterial: 38.8 mmHg (ref 32.0–48.0)
pH, Arterial: 7.388 (ref 7.350–7.450)
pO2, Arterial: 61 mmHg — ABNORMAL LOW (ref 83.0–108.0)

## 2016-07-16 LAB — CBC
HCT: 30.5 % — ABNORMAL LOW (ref 36.0–46.0)
Hemoglobin: 9.9 g/dL — ABNORMAL LOW (ref 12.0–15.0)
MCH: 27.3 pg (ref 26.0–34.0)
MCHC: 32.5 g/dL (ref 30.0–36.0)
MCV: 84.3 fL (ref 78.0–100.0)
Platelets: 204 10*3/uL (ref 150–400)
RBC: 3.62 MIL/uL — ABNORMAL LOW (ref 3.87–5.11)
RDW: 18.6 % — ABNORMAL HIGH (ref 11.5–15.5)
WBC: 12.7 10*3/uL — ABNORMAL HIGH (ref 4.0–10.5)

## 2016-07-16 LAB — CREATININE, SERUM
Creatinine, Ser: 0.84 mg/dL (ref 0.44–1.00)
GFR calc Af Amer: >60 mL/min (ref >60)
GFR calc non Af Amer: >60 mL/min (ref >60)

## 2016-07-16 LAB — SURGICAL PCR SCREEN
MRSA, PCR: POSITIVE — AB
Staphylococcus aureus: POSITIVE — AB

## 2016-07-16 LAB — APTT: aPTT: 32 seconds (ref 24–36)

## 2016-07-16 LAB — MAGNESIUM: Magnesium: 1.8 mg/dL (ref 1.7–2.4)

## 2016-07-16 MED ORDER — CHLORHEXIDINE GLUCONATE CLOTH 2 % EX PADS
6.0000 | MEDICATED_PAD | Freq: Every day | CUTANEOUS | Status: AC
  Administered 2016-07-17 – 2016-07-21 (×5): 6 via TOPICAL

## 2016-07-16 MED ORDER — THIOTHIXENE 10 MG PO CAPS
10.0000 mg | ORAL_CAPSULE | Freq: Two times a day (BID) | ORAL | Status: DC
  Administered 2016-07-16 – 2016-07-23 (×13): 10 mg via ORAL
  Filled 2016-07-16 (×16): qty 1

## 2016-07-16 MED ORDER — DIVALPROEX SODIUM 125 MG PO CSDR
750.0000 mg | DELAYED_RELEASE_CAPSULE | Freq: Two times a day (BID) | ORAL | Status: DC
  Filled 2016-07-16 (×2): qty 6

## 2016-07-16 MED ORDER — SODIUM CHLORIDE 0.9% FLUSH
3.0000 mL | Freq: Two times a day (BID) | INTRAVENOUS | Status: DC
  Administered 2016-07-16 – 2016-07-22 (×11): 3 mL via INTRAVENOUS

## 2016-07-16 MED ORDER — FENTANYL CITRATE (PF) 100 MCG/2ML IJ SOLN
12.5000 ug | INTRAMUSCULAR | Status: DC | PRN
  Administered 2016-07-17 (×3): 12.5 ug via INTRAVENOUS
  Filled 2016-07-16 (×3): qty 2

## 2016-07-16 MED ORDER — LORAZEPAM 2 MG/ML IJ SOLN
1.0000 mg | INTRAMUSCULAR | Status: DC | PRN
Start: 2016-07-16 — End: 2016-07-17
  Administered 2016-07-16 – 2016-07-17 (×2): 1 mg via INTRAVENOUS
  Filled 2016-07-16 (×2): qty 1

## 2016-07-16 MED ORDER — DIVALPROEX SODIUM 125 MG PO CSDR: 500.0000 mg | DELAYED_RELEASE_CAPSULE | Freq: Every day | ORAL | Status: DC

## 2016-07-16 MED ORDER — OLANZAPINE 10 MG PO TABS
30.0000 mg | ORAL_TABLET | Freq: Every day | ORAL | Status: DC
  Administered 2016-07-16 – 2016-07-22 (×6): 30 mg via ORAL
  Filled 2016-07-16 (×9): qty 3

## 2016-07-16 MED ORDER — ENOXAPARIN SODIUM 30 MG/0.3ML ~~LOC~~ SOLN
30.0000 mg | SUBCUTANEOUS | Status: DC
  Administered 2016-07-16 – 2016-07-22 (×6): 30 mg via SUBCUTANEOUS
  Filled 2016-07-16 (×6): qty 0.3

## 2016-07-16 MED ORDER — CHLORHEXIDINE GLUCONATE 0.12 % MT SOLN
15.0000 mL | Freq: Two times a day (BID) | OROMUCOSAL | Status: DC
  Administered 2016-07-16 – 2016-07-22 (×12): 15 mL via OROMUCOSAL
  Filled 2016-07-16 (×11): qty 15

## 2016-07-16 MED ORDER — ONDANSETRON HCL 4 MG PO TABS: 4.0000 mg | ORAL_TABLET | Freq: Four times a day (QID) | ORAL | Status: DC | PRN

## 2016-07-16 MED ORDER — ONDANSETRON HCL 4 MG/2ML IJ SOLN: 4.0000 mg | Freq: Four times a day (QID) | INTRAMUSCULAR | Status: DC | PRN

## 2016-07-16 MED ORDER — MUPIROCIN 2 % EX OINT
1.0000 "application " | TOPICAL_OINTMENT | Freq: Two times a day (BID) | CUTANEOUS | Status: AC
  Administered 2016-07-16 – 2016-07-20 (×10): 1 via NASAL
  Filled 2016-07-16 (×2): qty 22

## 2016-07-16 MED ORDER — ORAL CARE MOUTH RINSE
15.0000 mL | Freq: Two times a day (BID) | OROMUCOSAL | Status: DC
  Administered 2016-07-16 – 2016-07-18 (×4): 15 mL via OROMUCOSAL

## 2016-07-16 MED ORDER — ALBUTEROL SULFATE (2.5 MG/3ML) 0.083% IN NEBU
2.5000 mg | INHALATION_SOLUTION | Freq: Four times a day (QID) | RESPIRATORY_TRACT | Status: DC
  Administered 2016-07-16: 2.5 mg via RESPIRATORY_TRACT
  Filled 2016-07-16: qty 3

## 2016-07-16 MED ORDER — FAMOTIDINE IN NACL 20-0.9 MG/50ML-% IV SOLN
20.0000 mg | Freq: Two times a day (BID) | INTRAVENOUS | Status: DC
  Administered 2016-07-16 – 2016-07-18 (×5): 20 mg via INTRAVENOUS
  Filled 2016-07-16 (×5): qty 50

## 2016-07-16 MED ORDER — DEXTROSE-NACL 5-0.45 % IV SOLN
INTRAVENOUS | Status: DC
  Administered 2016-07-16 – 2016-07-17 (×2): via INTRAVENOUS

## 2016-07-16 MED ORDER — DIVALPROEX SODIUM 125 MG PO CSDR
1000.0000 mg | DELAYED_RELEASE_CAPSULE | Freq: Every day | ORAL | Status: DC
  Administered 2016-07-16 – 2016-07-22 (×6): 1000 mg via ORAL
  Filled 2016-07-16 (×8): qty 8

## 2016-07-16 MED ORDER — THIAMINE HCL 100 MG/ML IJ SOLN
100.0000 mg | Freq: Every day | INTRAMUSCULAR | Status: DC
  Administered 2016-07-16 – 2016-07-18 (×3): 100 mg via INTRAVENOUS
  Filled 2016-07-16 (×3): qty 2

## 2016-07-16 MED ORDER — ALBUTEROL SULFATE (2.5 MG/3ML) 0.083% IN NEBU: 2.5000 mg | INHALATION_SOLUTION | Freq: Four times a day (QID) | RESPIRATORY_TRACT | Status: DC | PRN

## 2016-07-16 MED ORDER — DIVALPROEX SODIUM 125 MG PO CSDR
500.0000 mg | DELAYED_RELEASE_CAPSULE | Freq: Every day | ORAL | Status: DC
  Administered 2016-07-16 – 2016-07-23 (×8): 500 mg via ORAL
  Filled 2016-07-16 (×8): qty 4

## 2016-07-16 MED ORDER — BUDESONIDE 0.5 MG/2ML IN SUSP
0.5000 mg | Freq: Two times a day (BID) | RESPIRATORY_TRACT | Status: DC
  Administered 2016-07-16 – 2016-07-17 (×2): 0.5 mg via RESPIRATORY_TRACT
  Filled 2016-07-16 (×4): qty 2

## 2016-07-16 MED ORDER — ATORVASTATIN CALCIUM 40 MG PO TABS
40.0000 mg | ORAL_TABLET | Freq: Every day | ORAL | Status: DC
  Administered 2016-07-17 – 2016-07-22 (×6): 40 mg via ORAL
  Filled 2016-07-16 (×7): qty 1

## 2016-07-16 NOTE — Progress Notes (Signed)
Pt. Placed on 2L of oxygen to assist with po2.

## 2016-07-16 NOTE — Progress Notes (Signed)
  Subjective:  No complaints  Objective: Vital signs in last 24 hours: Temp:  [98.2 F (36.8 C)-98.9 F (37.2 C)] 98.2 F (36.8 C) (11/23 0828) Pulse Rate:  [60-110] 60 (11/23 1100) Cardiac Rhythm: Normal sinus rhythm (11/23 0800) Resp:  [11-30] 11 (11/23 1100) BP: (106-137)/(53-92) 123/59 (11/23 1100) SpO2:  [93 %-100 %] 97 % (11/23 1100) Weight:  [96.1 kg (211 lb 13.8 oz)-96.6 kg (213 lb)] 96.1 kg (211 lb 13.8 oz) (11/23 0420)  Hemodynamic parameters for last 24 hours:    Intake/Output from previous day: 11/22 0701 - 11/23 0700 In: 2674.7 [I.V.:226.7; IV Piggyback:2448] Out: 500 [Urine:500] Intake/Output this shift: Total I/O In: 500 [I.V.:400; IV Piggyback:100] Out: 1100 [Urine:1100]  General appearance: alert and cooperative Heart: regular rate and rhythm, S1, S2 normal, no murmur, click, rub or gallop Lungs: clear to auscultation bilaterally Abdomen: soft, non-tender; bowel sounds normal; no masses,  no organomegaly  Lab Results:  Recent Labs  07/15/16 1921 07/16/16 0141  WBC 10.3 12.7*  HGB 10.9* 9.9*  HCT 33.8* 30.5*  PLT 223 204   BMET:  Recent Labs  07/15/16 1921 07/16/16 0141  NA 137  --   K 4.5  --   CL 102  --   CO2 27  --   GLUCOSE 120*  --   BUN 19  --   CREATININE 0.93 0.84  CALCIUM 9.4  --     PT/INR: No results for input(s): LABPROT, INR in the last 72 hours. ABG    Component Value Date/Time   PHART 7.388 07/16/2016 0530   HCO3 23.4 07/16/2016 0530   TCO2 25 07/16/2016 0530   ACIDBASEDEF 1.0 07/16/2016 0530   O2SAT 90.0 07/16/2016 0530   CBG (last 3)  No results for input(s): GLUCAP in the last 72 hours.  Assessment/Plan:  Pneumomediastinum of unclear etiology but most likely pulmonary. There was no extravasation of contrast from the esophagus or stomach on CT and no fluid around the esophagus. She has a GG swallow ordered for follow up but may not get done today due to holiday. Will give her antipsychotic meds po with sip  of water but keep NPO otherwise. Her venous lactic acid is back to normal, WBC minimally elevated. Will continue antibiotics for now.  Foley inserted this am for retention.   LOS: 0 days    Alleen BorneBryan K Elvenia Godden 07/16/2016

## 2016-07-16 NOTE — Progress Notes (Signed)
07/16/2016 1030 Pt. Voicing difficulty voiding despite multiple attempts. Bladder scan revealing >850 ml residual urine in bladder. Orders already in EPIC to place foley. Orders enacted. Foley placed via sterile technique per prior orders.  Analese Sovine, Blanchard KelchStephanie Ingold

## 2016-07-16 NOTE — Progress Notes (Signed)
07/16/2016 1200 Pt. Stating she takes 500 mg Depakote in the am and 1000 mg Depakote at bedtime. PTA med list reflects same information. Dr. Laneta SimmersBartle paged and made aware. Verbal order received ok to change to home dosing. Orders placed. Pt. Updated on plan of care. Eriyonna Matsushita, Blanchard KelchStephanie Ingold

## 2016-07-16 NOTE — H&P (Signed)
[address]       [place] [address]             [phone]        Betty Buchanan [facility] Medical Record #[id number] Date of Birth: October 09, 1950  Referring: No ref. provider found Primary Care: PROVIDER NOT IN SYSTEM  Chief Complaint:    Chief Complaint  Patient presents with  . Abdominal Pain  . Emesis    History of Present Illness:     65 yo smoker with bipolar disorder lives in facility Presented to [facility] ED with vomiting . BRBPR and abd pain CT chest, abdomen with large pneumomediastinum w/o pntx Retroperitoneal air w/o free air Initially acidotic and sepsis protocol started Oral contrast CT shows no evidence of esophogeal leak No hx of spontaneous pntx , chest or abdominal surgery Current Activity/ Functional Status: Lives in facility for bipolar disorder   Zubrod Score: At the time of surgery this patient's most appropriate activity status/level should be described as: []     0    Normal activity, no symptoms []     1    Restricted in physical strenuous activity but ambulatory, able to do out light work []     2    Ambulatory and capable of self care, unable to do work activities, up and about                 more than 50%  Of the time                            [x]     3    Only limited self care, in bed greater than 50% of waking hours []     4    Completely disabled, no self care, confined to bed or chair []     5    Moribund  Past Medical History:  Diagnosis Date  . Bipolar 1 disorder (HCC)   . GERD (gastroesophageal reflux disease)   . Hemiplegia (HCC)    L side  . Osteoarthritis   . Stroke [facility])     Past Surgical History:  Procedure Laterality Date  . KNEE ARTHROSCOPY      History  Smoking Status  . Current Every Day Smoker  . Types: Cigarettes  Smokeless Tobacco  . Never Used   History  Alcohol Use No    Social History   Social History  . Marital status: Single    Spouse name: N/A  . Number of children: N/A  . Years  of education: N/A   Occupational History  . Not on file.   Social History Main Topics  . Smoking status: Current Every Day Smoker    Types: Cigarettes  . Smokeless tobacco: Never Used  . Alcohol use No  . Drug use: No  . Sexual activity: Not on file   Other Topics Concern  . Not on file   Social History Narrative  . No narrative on file    Allergies  Allergen Reactions  . Penicillins     Unknown reaction Has patient had a PCN reaction causing immediate rash, facial/tongue/throat swelling, SOB or lightheadedness with hypotension:Unknown Has patient had a PCN reaction causing severe rash involving mucus membranes or skin necrosis:Unknown Has patient had a PCN reaction that required hospitalization:Unknown Has patient had a PCN reaction occurring within the last 10 years:Unknown If all of the above answers are "NO", then may proceed with  Cephalosporin use.     Current Facility-Administered Medications  Medication Dose Route Frequency Provider Last Rate Last Dose  . albuterol (PROVENTIL) (5 MG/ML) 0.5% nebulizer solution 2.5 mg  2.5 mg Nebulization Q6H Kerin PernaPeter Van Trigt, MD      . Melene Muller[START ON 07/17/2016] atorvastatin (LIPITOR) tablet 40 mg  40 mg Oral q1800 Kerin PernaPeter Van Trigt, MD      . aztreonam (AZACTAM) 2 g in dextrose 5 % 50 mL IVPB  2 g Intravenous Q8H Maurice Marchachel E Jackson, RPH      . budesonide (PULMICORT) nebulizer solution 0.5 mg  0.5 mg Nebulization BID Kerin PernaPeter Van Trigt, MD      . dextrose 5 %-0.45 % sodium chloride infusion   Intravenous Continuous Kerin PernaPeter Van Trigt, MD      . divalproex (DEPAKOTE SPRINKLE) capsule 750 mg  750 mg Oral Q12H Kerin PernaPeter Van Trigt, MD      . enoxaparin (LOVENOX) injection 30 mg  30 mg Subcutaneous Q24H Kerin PernaPeter Van Trigt, MD      . famotidine (PEPCID) IVPB 20 mg premix  20 mg Intravenous Q12H Kerin PernaPeter Van Trigt, MD      . fentaNYL (SUBLIMAZE) injection 12.5 mcg  12.5 mcg Intravenous Q2H PRN Kerin PernaPeter Van Trigt, MD      . iopamidol (ISOVUE-300) 61 % injection            . iopamidol (ISOVUE-300) 61 % injection           . levofloxacin (LEVAQUIN) IVPB 750 mg  750 mg Intravenous Q24H Maurice Marchachel E Jackson, [facility]      . LORazepam (ATIVAN) bolus via infusion 1 mg  1 mg Intravenous Q4H PRN Kerin PernaPeter Van Trigt, MD      . OLANZapine [facility](ZYPREXA) tablet 30 mg  30 mg Oral QHS Kerin PernaPeter Van Trigt, MD      . ondansetron [facility](ZOFRAN) tablet 4 mg  4 mg Oral Q6H PRN Kerin PernaPeter Van Trigt, MD       Or  . ondansetron [facility](ZOFRAN) injection 4 mg  4 mg Intravenous Q6H PRN Kerin PernaPeter Van Trigt, MD      . sodium chloride 0.9 % injection           . sodium chloride flush (NS) 0.9 % injection 3 mL  3 mL Intravenous Q12H Kerin PernaPeter Van Trigt, MD      . thiamine (B-1) injection 100 mg  100 mg Intravenous Daily Kerin PernaPeter Van Trigt, MD      . thiothixene (NAVANE) capsule 10 mg  10 mg Oral BID Kerin PernaPeter Van Trigt, MD      . vancomycin (VANCOCIN) 1,250 mg in sodium chloride 0.9 % 250 mL IVPB  1,250 mg Intravenous Q12H Maurice Marchachel E Jackson, RPH      . vancomycin (VANCOCIN) IVPB 1000 mg/200 mL premix  1,000 mg Intravenous Once Maurice MarchRachel E Jackson, [facility]   Stopped at 07/15/16 2128   Current Outpatient Prescriptions  Medication Sig Dispense Refill  . acetaminophen (TYLENOL) 325 MG tablet Take 975 mg by mouth 2 (two) times daily as needed for mild pain, moderate pain or fever. Do not exceed 3gm/24hr    . acetaminophen (TYLENOL) 500 MG tablet Take 500 mg by mouth every 6 (six) hours as needed for mild pain, moderate pain or fever. Do not exceed 3gm/24h    . aspirin EC 81 MG tablet Take 81 mg by mouth daily with breakfast.    . atorvastatin (LIPITOR) 40 MG tablet Take 40 mg by mouth every evening.    . divalproex (DEPAKOTE SPRINKLE) 125  MG capsule Take 500-1,000 mg by mouth 2 (two) times daily. Takes 500mg  in the morning and 1000mg  at night    . LORazepam (ATIVAN) 0.5 MG tablet Take 0.5 mg by mouth daily.    Marland Kitchen. LORazepam (ATIVAN) 1 MG tablet Take 1 mg by mouth every 4 (four) hours as needed for anxiety (and agitation).    . Melatonin 1 MG TABS Take 2  mg by mouth at bedtime.    . Multiple Vitamin (MULTIVITAMIN WITH MINERALS) TABS tablet Take 1 tablet by mouth daily.    Marland Kitchen. OLANZapine (ZYPREXA) 10 MG tablet Take 10 mg by mouth at bedtime. Take with 30 mg to get 40 mg dose    . OLANZapine (ZYPREXA) 15 MG tablet Take 30 mg by mouth at bedtime. Take with 10 mg dose to get 40 mg total    . pantoprazole (PROTONIX) 40 MG tablet Take 40 mg by mouth daily with breakfast.    . solifenacin (VESICARE) 10 MG tablet Take 10 mg by mouth daily.    Marland Kitchen. thiothixene (NAVANE) 10 MG capsule Take 10 mg by mouth 2 (two) times daily.       (Not in a hospital admission)  Family History  Problem Relation Age of Onset  . Cancer Father   . Stroke Father      Review of Systems:  Heavy smoker No ETOH Old L side CVA, weakness Able to walk with walker R hand dominant     Cardiac Review of Systems: Y or N  Chest Pain [  n  ]  Resting SOB [  n ] Exertional SOB  [ y ]  Orthopnea [  ]   Pedal Edema [ n  ]    Palpitations [n  ] Syncope  [ n ]   Presyncope [ n  ]  General Review of Systems: [Y] = yes [  ]=no Constitional: recent weight change [  ]; anorexia [  ]; fatigue [  ]; nausea [  ]; night sweats [  ]; fever [  ]; or chills [  ]                                                               Dental: poor dentition[ plates  ]; Last Dentist visit: unknown  Eye : blurred vision [  ]; diplopia [   ]; vision changes [  ];  Amaurosis fugax[  ]; Resp: cough [ y ];  wheezing[ y ];  hemoptysis[  ]; shortness of breath[ y ]; paroxysmal nocturnal dyspnea[  ]; dyspnea on exertion[  ]; or orthopnea[  ];  GI:  gallstones[  ], vomiting[y  ];  dysphagia[  ]; melena[  ];  hematochezia [ y ]; heartburn[  ];   Hx of  Colonoscopy[  ]; GU: kidney stones [  ]; hematuria[  ];   dysuria [  ];  nocturia[  ];  history of     obstruction [  ]; urinary frequency [  ]             Skin: rash, swelling[  ];, hair loss[  ];  peripheral edema[  ];  or itching[  ]; Musculosketetal: myalgias[  ];   joint swelling[  ];  joint erythema[  ];  joint pain[  ];  back pain[  ];  Heme/Lymph: bruising[  ];  bleeding[  ];  anemia[  ];  Neuro: TIA[  ];  headaches[  ];  stroke[  ];  vertigo[  ];  seizures[  ];   paresthesias[  ];  difficulty walking[ y ];  Psych:depression[  ]; anxiety[  y];  Endocrine: diabetes[  ];  thyroid dysfunction[  ];  Immunizations: Flu [  ]; Pneumococcal[  ];  Other:  Physical Exam: BP 137/78 (BP Location: Left Arm)   Pulse 95   Temp 98.9 F (37.2 C) (Oral)   Resp 18   Ht 5\' 6"  (1.676 m)   Wt 213 lb (96.6 kg)   SpO2 97%   BMI 34.38 kg/m       Physical Exam  General: confused AA female appears older than stated age HEENT: Normocephalic pupils equal , dentition plates Neck: Supple without JVD, adenopathy, or bruit, mild crepitus on L side Chest: Clear to auscultation, symmetrical breath sounds, no rhonchi, no tenderness             or deformity Cardiovascular: Regular rate and rhythm, no murmur, no gallop, peripheral pulses             palpable in all extremities Abdomen:  Distended, mild tenderness no palpable mass or organomegaly Extremities: Warm, well-perfused, no clubbing cyanosis edema or tenderness,              no venous stasis changes of the legs Rectal/GU: Deferred Neuro: L arm, leg with weakness Skin: Clean and dry without rash or ulceration    Diagnostic Studies & Laboratory data:     Recent Radiology Findings:   Dg Chest 2 View  Result Date: 07/15/2016 CLINICAL DATA:  Right lower quadrant pain. EXAM: CHEST  2 VIEW COMPARISON:  None. FINDINGS: Pneumomediastinum seen along the right heart border. Gas is seen in the right supraclavicular fossa. Probable Subdiaphragmatic gas on the right. Normal heart size. No consolidation or edema. No definite pneumothorax. Critical Value/emergent results were called by telephone at the time of interpretation on 07/15/2016 at 7:46 pm to Dr. Lynden Oxford , who verbally acknowledged these results.  IMPRESSION: Pneumomediastinum and probable pneumoperitoneum. Soft tissue emphysema in the supraclavicular fossa.Recommend CT to determine the source. Electronically Signed   By: Marnee Spring M.D.   On: 07/15/2016 19:47   Ct Chest Wo Contrast  Result Date: 07/15/2016 CLINICAL DATA:  Pneumomediastinum, concern for perforation EXAM: CT CHEST WITHOUT CONTRAST TECHNIQUE: Multidetector CT imaging of the chest was performed following the standard protocol without IV contrast. COMPARISON:  CT 07/15/2016, radiograph 07/15/2016 FINDINGS: Cardiovascular: Limited without intravenous contrast. Minimal calcifications in the aorta. No pericardial effusion. Mediastinum/Nodes: No significantly enlarged lymph nodes. There is a small amount of intraluminal contrast present within the esophagus. There is no extraluminal contrast present within the mediastinum. There is contrast within the stomach without evidence for extraluminal contrast within the upper abdomen.Extensive pneumo mediastinum again visualized from the neck with gas collections present in the supraclavicular fossa on the right. Moderate amount of pneumomediastinum surrounding the great vessels, trachea, and esophagus. Extension of pneumomediastinum through the diaphragmatic hiatus. Partially visualized pockets of pneumoperitoneum as before. Lungs/Pleura: There is no evidence for pleural effusion or consolidation. No significant pneumothorax. Few scattered lung blebs. Upper Abdomen: Small pockets of pneumoperitoneum are visualized. There is contrast present within the stomach. Musculoskeletal: No suspicious bone lesions. IMPRESSION: 1. Small amount of intraluminal contrast present within the esophagus. No extravasation of contrast/extraluminal contrast present within the mediastinum. Contrast  is also present within the stomach and there is no extraluminal contrast visualized in the upper abdomen. There is no significant pleural effusion. 2. Extensive  pneumomediastinum again visualized. Small amount of pneumoperitoneum partially visualized. Electronically Signed   By: Jasmine Pang M.D.   On: 07/15/2016 23:31   Ct Chest W Contrast  Result Date: 07/15/2016 CLINICAL DATA:  Vomiting since noon today. Constipation and low abdominal pain. Chest radiograph showed evidence of pneumo mediastinum. EXAM: CT CHEST, ABDOMEN, AND PELVIS WITH CONTRAST TECHNIQUE: Multidetector CT imaging of the chest, abdomen and pelvis was performed following the standard protocol during bolus administration of intravenous contrast. CONTRAST:  ISOVUE-300 IOPAMIDOL (ISOVUE-300) INJECTION 61% COMPARISON:  Chest radiograph 07/15/2016 FINDINGS: CT CHEST FINDINGS Cardiovascular: No significant vascular findings. Normal heart size. No pericardial effusion. Mediastinum/Nodes: There is extensive pneumo mediastinum with gas demonstrated in the anterior and middle mediastinal compartments common tracking along the esophagus, and with subcutaneous gas in the base of the neck and right supraclavicular region. The esophagus is decompressed. No significant lymphadenopathy in the chest. Lungs/Pleura: Evaluation of lungs is limited due to respiratory motion artifact. No pneumothorax. Atelectasis in the lung bases. No focal consolidation. Airways are patent. No pleural effusions. Musculoskeletal: Degenerative changes in the thoracic spine. No acute displaced fractures are demonstrated. No destructive bone lesions. CT ABDOMEN PELVIS FINDINGS Hepatobiliary: No focal liver abnormality is seen. No gallstones, gallbladder wall thickening, or biliary dilatation. Pancreas: Unremarkable. No pancreatic ductal dilatation or surrounding inflammatory changes. Spleen: Normal in size without focal abnormality. Adrenals/Urinary Tract: Adrenal glands are unremarkable. Kidneys are normal, without renal calculi, focal lesion, or hydronephrosis. Bladder is unremarkable. Stomach/Bowel: Stomach, small bowel, and colon  are not abnormally distended. Small bowel are mostly decompressed. Colon is diffusely stool-filled consistent with history of constipation. No wall thickening is appreciated. Appendix is normal. Vascular/Lymphatic: Aortic atherosclerosis. No enlarged abdominal or pelvic lymph nodes. Reproductive: Uterus and bilateral adnexa are unremarkable. Other: There is diffuse free air demonstrated in the abdomen and pelvis mostly in the retroperitoneal spaces. Gas collection surround of the rectosigmoid colon, para-aortic and pericaval spaces, bilateral para renal spaces and renal sinus, with extension of into the subdiaphragmatic spaces bilaterally. Mesenteric gas is also present centrally. No free intraperitoneal air. No free fluid in the abdomen or pelvis. Abdominal wall musculature appears intact. Musculoskeletal: Degenerative changes in the lumbar spine. No acute displaced fractures or focal destructive bone lesions appreciated. IMPRESSION: Extraluminal gas is demonstrated extending from the base of the neck/right supraclavicular region, throughout the mediastinum, throughout the abdominal and pelvic retroperitoneal spaces, in the bilateral subdiaphragmatic spaces and in the central mesentery. No free fluid. Given the history of vomiting, this may arise from esophageal rupture consistent with Boerhaave disease. Perforation of trachea/alveoli or other retroperitoneal hollow viscus is not excluded. Specific site of perforation is not indicated. These results were called by telephone at the time of interpretation on 07/15/2016 at 9:40 pm to Dr. Lynden Oxford , who verbally acknowledged these results. Electronically Signed   By: Burman Nieves M.D.   On: 07/15/2016 21:46   Ct Abdomen Pelvis W Contrast  Result Date: 07/15/2016 CLINICAL DATA:  Vomiting since noon today. Constipation and low abdominal pain. Chest radiograph showed evidence of pneumo mediastinum. EXAM: CT CHEST, ABDOMEN, AND PELVIS WITH CONTRAST  TECHNIQUE: Multidetector CT imaging of the chest, abdomen and pelvis was performed following the standard protocol during bolus administration of intravenous contrast. CONTRAST:  ISOVUE-300 IOPAMIDOL (ISOVUE-300) INJECTION 61% COMPARISON:  Chest radiograph 07/15/2016 FINDINGS: CT CHEST  FINDINGS Cardiovascular: No significant vascular findings. Normal heart size. No pericardial effusion. Mediastinum/Nodes: There is extensive pneumo mediastinum with gas demonstrated in the anterior and middle mediastinal compartments common tracking along the esophagus, and with subcutaneous gas in the base of the neck and right supraclavicular region. The esophagus is decompressed. No significant lymphadenopathy in the chest. Lungs/Pleura: Evaluation of lungs is limited due to respiratory motion artifact. No pneumothorax. Atelectasis in the lung bases. No focal consolidation. Airways are patent. No pleural effusions. Musculoskeletal: Degenerative changes in the thoracic spine. No acute displaced fractures are demonstrated. No destructive bone lesions. CT ABDOMEN PELVIS FINDINGS Hepatobiliary: No focal liver abnormality is seen. No gallstones, gallbladder wall thickening, or biliary dilatation. Pancreas: Unremarkable. No pancreatic ductal dilatation or surrounding inflammatory changes. Spleen: Normal in size without focal abnormality. Adrenals/Urinary Tract: Adrenal glands are unremarkable. Kidneys are normal, without renal calculi, focal lesion, or hydronephrosis. Bladder is unremarkable. Stomach/Bowel: Stomach, small bowel, and colon are not abnormally distended. Small bowel are mostly decompressed. Colon is diffusely stool-filled consistent with history of constipation. No wall thickening is appreciated. Appendix is normal. Vascular/Lymphatic: Aortic atherosclerosis. No enlarged abdominal or pelvic lymph nodes. Reproductive: Uterus and bilateral adnexa are unremarkable. Other: There is diffuse free air demonstrated in the  abdomen and pelvis mostly in the retroperitoneal spaces. Gas collection surround of the rectosigmoid colon, para-aortic and pericaval spaces, bilateral para renal spaces and renal sinus, with extension of into the subdiaphragmatic spaces bilaterally. Mesenteric gas is also present centrally. No free intraperitoneal air. No free fluid in the abdomen or pelvis. Abdominal wall musculature appears intact. Musculoskeletal: Degenerative changes in the lumbar spine. No acute displaced fractures or focal destructive bone lesions appreciated. IMPRESSION: Extraluminal gas is demonstrated extending from the base of the neck/right supraclavicular region, throughout the mediastinum, throughout the abdominal and pelvic retroperitoneal spaces, in the bilateral subdiaphragmatic spaces and in the central mesentery. No free fluid. Given the history of vomiting, this may arise from esophageal rupture consistent with Boerhaave disease. Perforation of trachea/alveoli or other retroperitoneal hollow viscus is not excluded. Specific site of perforation is not indicated. These results were called by telephone at the time of interpretation on 07/15/2016 at 9:40 pm to Dr. Lynden Oxford , who verbally acknowledged these results. Electronically Signed   By: Burman Nieves M.D.   On: 07/15/2016 21:46     I have independently reviewed the above radiologic studies.  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

## 2016-07-17 ENCOUNTER — Inpatient Hospital Stay (HOSPITAL_COMMUNITY): Payer: Medicare HMO

## 2016-07-17 LAB — URINE CULTURE

## 2016-07-17 MED ORDER — ASPIRIN EC 81 MG PO TBEC
81.0000 mg | DELAYED_RELEASE_TABLET | Freq: Every day | ORAL | Status: DC
  Administered 2016-07-17 – 2016-07-23 (×7): 81 mg via ORAL
  Filled 2016-07-17 (×7): qty 1

## 2016-07-17 MED ORDER — DIPHENHYDRAMINE HCL 50 MG/ML IJ SOLN: 12.5000 mg | Freq: Four times a day (QID) | INTRAMUSCULAR | Status: DC | PRN

## 2016-07-17 MED ORDER — DIPHENHYDRAMINE HCL 50 MG/ML IJ SOLN
6.2500 mg | Freq: Four times a day (QID) | INTRAMUSCULAR | Status: DC | PRN
  Administered 2016-07-17: 6.5 mg via INTRAVENOUS
  Filled 2016-07-17 (×2): qty 1

## 2016-07-17 MED ORDER — SORBITOL 70 % SOLN
30.0000 mL | Freq: Once | Status: AC
  Administered 2016-07-17: 30 mL via ORAL
  Filled 2016-07-17: qty 30

## 2016-07-17 MED ORDER — IOPAMIDOL (ISOVUE-300) INJECTION 61%
INTRAVENOUS | Status: AC
  Administered 2016-07-17: 100 mL via ORAL
  Filled 2016-07-17: qty 150

## 2016-07-17 MED ORDER — LORAZEPAM 2 MG/ML IJ SOLN
0.5000 mg | INTRAMUSCULAR | Status: DC | PRN
  Administered 2016-07-17 (×2): 0.5 mg via INTRAVENOUS
  Filled 2016-07-17 (×2): qty 1

## 2016-07-17 MED ORDER — IOPAMIDOL (ISOVUE-300) INJECTION 61%
150.0000 mL | Freq: Once | INTRAVENOUS | Status: AC | PRN
  Administered 2016-07-17: 100 mL via ORAL

## 2016-07-17 NOTE — Plan of Care (Signed)
Patient trying to get OOB, pulling off monitor and pulse ox, will not listen to nurse, repositioned multiple times, moans and hollows out inappropriately, telesitter initiated

## 2016-07-17 NOTE — Progress Notes (Signed)
  Subjective: Nausea improved gastrografin swallow definitively shows no esophageal perforation Reduce antibiotics- cultures negative DC vanc and Aztreonam Cont levaquin for now Resume diet and transfer to stepdown- CSW to start placement effort Patient lives in a facility for her psychosis Objective: Vital signs in last 24 hours: Temp:  [98.9 F (37.2 C)-99.8 F (37.7 C)] 99.8 F (37.7 C) (11/24 1212) Pulse Rate:  [50-90] 90 (11/24 1000) Cardiac Rhythm: Normal sinus rhythm (11/24 0800) Resp:  [11-17] 13 (11/24 1000) BP: (103-136)/(54-80) 130/80 (11/24 1100) SpO2:  [91 %-100 %] 98 % (11/24 1000) Weight:  [211 lb 10.3 oz (96 kg)] 211 lb 10.3 oz (96 kg) (11/24 0500)  Hemodynamic parameters for last 24 hours:  stable  Intake/Output from previous day: 11/23 0701 - 11/24 0700 In: 2363 [I.V.:1713; IV Piggyback:650] Out: 2860 [Urine:2860] Intake/Output this shift: Total I/O In: 270 [P.O.:120; I.V.:150] Out: 135 [Urine:135]       Exam    General- alert and comfortable   Lungs- clear without rales, wheezes   Cor- regular rate and rhythm, no murmur , gallop   Abdomen- soft, non-tender   Extremities - warm, non-tender, minimal edema   Neuro- abnormal but baseline   Lab Results:  Recent Labs  07/15/16 1921 07/16/16 0141  WBC 10.3 12.7*  HGB 10.9* 9.9*  HCT 33.8* 30.5*  PLT 223 204   BMET:  Recent Labs  07/15/16 1921 07/16/16 0141  NA 137  --   K 4.5  --   CL 102  --   CO2 27  --   GLUCOSE 120*  --   BUN 19  --   CREATININE 0.93 0.84  CALCIUM 9.4  --     PT/INR: No results for input(s): LABPROT, INR in the last 72 hours. ABG    Component Value Date/Time   PHART 7.388 07/16/2016 0530   HCO3 23.4 07/16/2016 0530   TCO2 25 07/16/2016 0530   ACIDBASEDEF 1.0 07/16/2016 0530   O2SAT 90.0 07/16/2016 0530   CBG (last 3)  No results for input(s): GLUCAP in the last 72 hours.  Assessment/Plan: S/P  Mobilize Plan for transfer to step-down: see transfer  orders   LOS: 1 day    Betty Buchanan 07/17/2016

## 2016-07-18 ENCOUNTER — Inpatient Hospital Stay (HOSPITAL_COMMUNITY): Payer: Medicare HMO

## 2016-07-18 LAB — BASIC METABOLIC PANEL
Anion gap: 8 (ref 5–15)
BUN: 7 mg/dL (ref 6–20)
CO2: 25 mmol/L (ref 22–32)
Calcium: 9.1 mg/dL (ref 8.9–10.3)
Chloride: 103 mmol/L (ref 101–111)
Creatinine, Ser: 0.75 mg/dL (ref 0.44–1.00)
GFR calc Af Amer: >60 mL/min (ref >60)
GFR calc non Af Amer: >60 mL/min (ref >60)
Glucose, Bld: 110 mg/dL — ABNORMAL HIGH (ref 65–99)
Potassium: 3.8 mmol/L (ref 3.5–5.1)
Sodium: 136 mmol/L (ref 135–145)

## 2016-07-18 LAB — CBC
HCT: 31.2 % — ABNORMAL LOW (ref 36.0–46.0)
Hemoglobin: 10 g/dL — ABNORMAL LOW (ref 12.0–15.0)
MCH: 26.8 pg (ref 26.0–34.0)
MCHC: 32.1 g/dL (ref 30.0–36.0)
MCV: 83.6 fL (ref 78.0–100.0)
Platelets: 205 10*3/uL (ref 150–400)
RBC: 3.73 MIL/uL — ABNORMAL LOW (ref 3.87–5.11)
RDW: 18.5 % — ABNORMAL HIGH (ref 11.5–15.5)
WBC: 10.3 10*3/uL (ref 4.0–10.5)

## 2016-07-18 LAB — URINE CULTURE
Culture: NO GROWTH
Special Requests: NORMAL

## 2016-07-18 MED ORDER — FAMOTIDINE 20 MG PO TABS
20.0000 mg | ORAL_TABLET | Freq: Two times a day (BID) | ORAL | Status: DC
  Administered 2016-07-18 – 2016-07-23 (×10): 20 mg via ORAL
  Filled 2016-07-18 (×10): qty 1

## 2016-07-18 MED ORDER — DICLOFENAC SODIUM 1 % TD GEL
4.0000 g | Freq: Four times a day (QID) | TRANSDERMAL | Status: AC
  Administered 2016-07-18 – 2016-07-22 (×15): 4 g via TOPICAL
  Filled 2016-07-18: qty 100

## 2016-07-18 MED ORDER — ACETAMINOPHEN 325 MG PO TABS
650.0000 mg | ORAL_TABLET | Freq: Four times a day (QID) | ORAL | Status: DC | PRN
  Administered 2016-07-18 – 2016-07-20 (×4): 650 mg via ORAL
  Filled 2016-07-18 (×4): qty 2

## 2016-07-18 MED ORDER — VITAMIN B-1 100 MG PO TABS
100.0000 mg | ORAL_TABLET | Freq: Every day | ORAL | Status: DC
  Administered 2016-07-19 – 2016-07-23 (×5): 100 mg via ORAL
  Filled 2016-07-18 (×5): qty 1

## 2016-07-18 NOTE — Evaluation (Signed)
Physical Therapy Evaluation Patient Details Name: Betty HerbertShirley Buchanan MRN: [id number] DOB: June 21, 1951 Today's Date: 07/18/2016   History of Present Illness  Patient is a 65 yo female admitted 07/15/16 with abdominal pain and emesis.  Patient with mediastinal emphysema.    PMH:  Bipolar disorder, CVA with Lt hemiparesis, OA, esp Rt hip  Clinical Impression  Patient presents with problems listed below.  Will benefit from acute PT to maximize functional mobility prior to return [facility].      Follow Up Recommendations SNF;Supervision/Assistance - 24 hour ([facility])    Equipment Recommendations  None recommended by PT    Recommendations for Other Services       Precautions / Restrictions Precautions Precautions: Fall Precaution Comments: Per chart, patient attempts to get OOB.  Patient with video sitter Restrictions Weight Bearing Restrictions: No      Mobility  Bed Mobility Overal bed mobility: Needs Assistance;+2 for physical assistance Bed Mobility: Rolling;Supine to Sit;Sit to Supine Rolling: Mod assist   Supine to sit: Mod assist;+2 for physical assistance Sit to supine: Max assist;+2 for physical assistance   General bed mobility comments: Verbal cues to move to EOB.  Mod assist to move LE's off of and onto bed due to Rt hip pain.  Assist to raise trunk to sitting position.  Once upright, patient required UE support and min-mod assist to maintain sitting balance.  Patient sat EOB x 6 minutes.  Transfers                 General transfer comment: NT  Ambulation/Gait                Stairs            Wheelchair Mobility    Modified Rankin (Stroke Patients Only)       Balance Overall balance assessment: Needs assistance Sitting-balance support: Bilateral upper extremity supported;Feet supported Sitting balance-Leahy Scale: Poor   Postural control: Posterior lean                                    Pertinent Vitals/Pain Pain Assessment: Faces Faces Pain Scale: Hurts even more Pain Location: Rt hip Pain Descriptors / Indicators: Sore Pain Intervention(s): Monitored during session;Repositioned    Home Living Family/patient expects to be discharged to:: Skilled nursing facility [facility]([place])                      Prior Function Level of Independence: Needs assistance               Hand Dominance   Dominant Hand: Right    Extremity/Trunk Assessment   Upper Extremity Assessment: Overall WFL for tasks assessed           Lower Extremity Assessment: Generalized weakness (RLE grossly 4-/5;  LLE grossly 3+/5)         Communication   Communication: Expressive difficulties (Mumbling speech)  Cognition Arousal/Alertness: Lethargic Behavior During Therapy: Anxious;Flat affect Overall Cognitive Status: No family/caregiver present to determine baseline cognitive functioning (Disoriented to place, time, situation)                      General Comments      Exercises     Assessment/Plan    PT Assessment Patient needs continued PT services  PT Problem List Decreased strength;Decreased activity tolerance;Decreased balance;Decreased mobility;Decreased cognition;Decreased knowledge of use of DME;Decreased  safety awareness;Pain          PT Treatment Interventions DME instruction;Gait training;Functional mobility training;Therapeutic activities;Therapeutic exercise;Balance training;Patient/family education    PT Goals (Current goals can be found in the Care Plan section)  Acute Rehab PT Goals Patient Stated Goal: None stated PT Goal Formulation: With patient Time For Goal Achievement: 08/01/16 Potential to Achieve Goals: Fair    Frequency Min 2X/week   Barriers to discharge        Co-evaluation               End of Session   Activity Tolerance: Patient limited by fatigue;Patient limited by pain Patient left: in bed;with call  bell/phone within reach Nurse Communication: Mobility status         Time: [id number] PT Time Calculation (min) (ACUTE ONLY): 15 min   Charges:   PT Evaluation $PT Eval Moderate Complexity: 1 Procedure     PT G Codes:        Vena AustriaSusan H Shamara Soza 07/18/2016, 2:41 PM Durenda HurtSusan H. Renaldo Fiddleravis, PT, [facility] Acute Rehab Services Pager [phone]

## 2016-07-18 NOTE — Progress Notes (Signed)
Received patient from 2S. Patient total lift/slide to bed. Telemetry applied. CCMD notified. Patient on telesitter. Sitter called regarding transfer of patient. Vitals WNL. Will continue to monitor.  Minerva Endsiffany N Eniyah Eastmond, RN

## 2016-07-18 NOTE — Progress Notes (Signed)
  Subjective:  No specific complaints  Objective: Vital signs in last 24 hours: Temp:  [98.9 F (37.2 C)-101.1 F (38.4 C)] 98.9 F (37.2 C) (11/25 0425) Pulse Rate:  [70-117] 78 (11/25 0600) Cardiac Rhythm: Normal sinus rhythm (11/25 0600) Resp:  [12-24] 19 (11/25 0600) BP: (117-156)/(68-101) 133/93 (11/25 0600) SpO2:  [86 %-100 %] 100 % (11/25 0600) Weight:  [95.9 kg (211 lb 6.7 oz)] 95.9 kg (211 lb 6.7 oz) (11/25 0500)  Hemodynamic parameters for last 24 hours:    Intake/Output from previous day: 11/24 0701 - 11/25 0700 In: 1674.3 [P.O.:540; I.V.:884.3; IV Piggyback:250] Out: 1895 [Urine:1895] Intake/Output this shift: No intake/output data recorded.  General appearance: lethargic Neurologic: mumbles, moves all extremities Heart: regular rate and rhythm, S1, S2 normal, no murmur, click, rub or gallop Lungs: clear to auscultation bilaterally Abdomen: soft, non-tender; bowel sounds normal; no masses,  no organomegaly  Lab Results:  Recent Labs  07/16/16 0141 07/18/16 0406  WBC 12.7* 10.3  HGB 9.9* 10.0*  HCT 30.5* 31.2*  PLT 204 205   BMET:  Recent Labs  07/15/16 1921 07/16/16 0141 07/18/16 0406  NA 137  --  136  K 4.5  --  3.8  CL 102  --  103  CO2 27  --  25  GLUCOSE 120*  --  110*  BUN 19  --  7  CREATININE 0.93 0.84 0.75  CALCIUM 9.4  --  9.1    PT/INR: No results for input(s): LABPROT, INR in the last 72 hours. ABG    Component Value Date/Time   PHART 7.388 07/16/2016 0530   HCO3 23.4 07/16/2016 0530   TCO2 25 07/16/2016 0530   ACIDBASEDEF 1.0 07/16/2016 0530   O2SAT 90.0 07/16/2016 0530   CBG (last 3)  No results for input(s): GLUCAP in the last 72 hours.  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 GG swallow negative for esophageal perforation.  Diet as tolerated  OOB.  DC foley  Awaiting stepdown bed.  LOS: 2 days    Alleen BorneBryan K Bartle 07/18/2016

## 2016-07-18 NOTE — Progress Notes (Signed)
Pharmacy Antibiotic Note  Betty HerbertShirley Buchanan is a 65 y.o. female admitted on 07/15/2016 with sepsis.  Pharmacy has been  levaquin dosing.   -WBC= 10.3, tmax= 101.1, CrCl ~ 80  Plan: -Continue levaquin 750mg  IV q24h -consider defining length of therapy -Will follow renal function, cultures and clinical progress    Height: 5\' 7"  (170.2 cm) Weight: 211 lb 6.7 oz (95.9 kg) IBW/kg (Calculated) : 61.6  Temp (24hrs), Avg:99.4 F (37.4 C), Min:98.4 F (36.9 C), Max:101.1 F (38.4 C)   Recent Labs Lab 07/15/16 1921 07/15/16 1926 07/15/16 2242 07/16/16 0141 07/18/16 0406  WBC 10.3  --   --  12.7* 10.3  CREATININE 0.93  --   --  0.84 0.75  LATICACIDVEN  --  2.38* 1.11  --   --     Estimated Creatinine Clearance: 84.5 mL/min (by C-G formula based on SCr of 0.75 mg/dL).    Allergies  Allergen Reactions  . Penicillins     Unknown reaction Has patient had a PCN reaction causing immediate rash, facial/tongue/throat swelling, SOB or lightheadedness with hypotension:Unknown Has patient had a PCN reaction causing severe rash involving mucus membranes or skin necrosis:Unknown Has patient had a PCN reaction that required hospitalization:Unknown Has patient had a PCN reaction occurring within the last 10 years:Unknown If all of the above answers are "NO", then may proceed with Cephalosporin use.     Antimicrobials this admission: 11/22 aztronam >> 11/24 11/22 levaquin >> 11/22 vancomycin >> 11/24   Microbiology results: 11/14 urine- neg 11/22 BCx: ngtd 11/22 UCx: multiple 11/23 MRSA PCR- positive  Thank you for allowing pharmacy to be a part of this patient's care.  Harland GermanAndrew Jose Corvin, Pharm D 07/18/2016 1:51 PM

## 2016-07-19 MED ORDER — LEVOFLOXACIN 750 MG PO TABS
750.0000 mg | ORAL_TABLET | Freq: Every day | ORAL | Status: AC
  Administered 2016-07-19 – 2016-07-21 (×3): 750 mg via ORAL
  Filled 2016-07-19 (×3): qty 1

## 2016-07-19 NOTE — Progress Notes (Signed)
Pharmacy Antibiotic Note  Betty Buchanan is a 65 y.o. female admitted on 07/15/2016 with sepsis.  Pharmacy has been  levaquin dosing (day 4).   -WBC= 10.3, afebrile, CrCl ~ 80  Plan: -Change levaquin to po  -consider defining length of therapy -Will sign off. Please contact pharmacy with any other needs.  Height: 5\' 7"  (170.2 cm) Weight: 256 lb (116.1 kg) IBW/kg (Calculated) : 61.6  Temp (24hrs), Avg:99.3 F (37.4 C), Min:99.1 F (37.3 C), Max:99.4 F (37.4 C)   Recent Labs Lab 07/15/16 1921 07/15/16 1926 07/15/16 2242 07/16/16 0141 07/18/16 0406  WBC 10.3  --   --  12.7* 10.3  CREATININE 0.93  --   --  0.84 0.75  LATICACIDVEN  --  2.38* 1.11  --   --     Estimated Creatinine Clearance: 93.5 mL/min (by C-G formula based on SCr of 0.75 mg/dL).    Allergies  Allergen Reactions  . Penicillins     Unknown reaction Has patient had a PCN reaction causing immediate rash, facial/tongue/throat swelling, SOB or lightheadedness with hypotension:Unknown Has patient had a PCN reaction causing severe rash involving mucus membranes or skin necrosis:Unknown Has patient had a PCN reaction that required hospitalization:Unknown Has patient had a PCN reaction occurring within the last 10 years:Unknown If all of the above answers are "NO", then may proceed with Cephalosporin use.     Antimicrobials this admission: 11/22 aztronam >> 11/24 11/22 levaquin >> 11/22 vancomycin >> 11/24   Microbiology results: 11/14 urine- neg 11/22 BCx: ngtd 11/22 UCx: multiple 11/23 MRSA PCR- positive  Thank you for allowing pharmacy to be a part of this patient's care.  Harland GermanAndrew Corbyn Wildey, Pharm D 07/19/2016 11:46 AM

## 2016-07-19 NOTE — Progress Notes (Signed)
0230 Pt has not urinated. Bladder scanned pt. Scanned about 500ml. Pt refused in and out cath. Wanted to wait until later.   0630 encouraged pt to urinate on bedpan again and she was unable to urinate. In and out cathed pt. Will continue to follow.  GrenadaBrittany May, RN

## 2016-07-19 NOTE — Progress Notes (Addendum)
      [address]       [place] [id number]             [phone]           Subjective: Patient states her belly does not hurt.  Objective: Vital signs in last 24 hours: Temp:  [99.1 F (37.3 C)-99.4 F (37.4 C)] 99.3 F (37.4 C) (11/26 0328) Pulse Rate:  [60-103] 103 (11/26 0328) Cardiac Rhythm: Normal sinus rhythm (11/26 1129) Resp:  [15-23] 18 (11/26 0328) BP: (111-149)/(49-77) 111/64 (11/26 0328) SpO2:  [97 %-100 %] 99 % (11/26 0328) Weight:  [256 lb (116.1 kg)] 256 lb (116.1 kg) (11/26 0328)     Intake/Output from previous day: 11/25 0701 - 11/26 0700 In: 360 [P.O.:120; I.V.:240] Out: 800 [Urine:800]   Physical Exam:  Cardiovascular: RRR Pulmonary: Clear to auscultation bilaterally Abdomen: Soft, non tender, bowel sounds present.   Lab Results: CBC: Recent Labs  07/18/16 0406  WBC 10.3  HGB 10.0*  HCT 31.2*  PLT 205   BMET:  Recent Labs  07/18/16 0406  NA 136  K 3.8  CL 103  CO2 25  GLUCOSE 110*  BUN 7  CREATININE 0.75  CALCIUM 9.1    PT/INR: No results for input(s): LABPROT, INR in the last 72 hours. ABG:  INR: Will add last result for INR, ABG once components are confirmed Will add last 4 CBG results once components are confirmed  Assessment/Plan:  1. CV - SR in the 90's. 2.  Pulmonary - Mediastinal emphysema most likely from pulmonary origin as swallow study on 11/24 was NEGATIVE for esophageal perforation. 3. Urinary retention-in and out cath'd earlier. UC showed no growth. Monitor need for foley. 4. ID- On Levaquin (day # 4) and changed to oral today. 5. GI-tolerating full liquids. Will advance to soft heart healthy diet. 6. Will return to a facility [facility]([place]) when stable for discharge  ZIMMERMAN,DONIELLE MPA-C 07/19/2016,1:41 PM   Chart reviewed, patient examined, agree with above.

## 2016-07-20 LAB — CULTURE, BLOOD (ROUTINE X 2)
CULTURE: NO GROWTH
CULTURE: NO GROWTH

## 2016-07-20 MED ORDER — LACTULOSE 10 GM/15ML PO SOLN
20.0000 g | Freq: Every day | ORAL | Status: DC | PRN
  Administered 2016-07-20: 20 g via ORAL
  Filled 2016-07-20: qty 30

## 2016-07-20 MED ORDER — BISACODYL 10 MG RE SUPP
10.0000 mg | Freq: Every day | RECTAL | Status: DC | PRN
  Administered 2016-07-20: 10 mg via RECTAL
  Filled 2016-07-20: qty 1

## 2016-07-20 NOTE — Progress Notes (Signed)
Physical Therapy Treatment Patient Details Name: Betty Buchanan MRN: [id number] DOB: 09-08-1950 Today's Date: 07/20/2016    History of Present Illness Patient is a 65 yo female admitted 07/15/16 with abdominal pain and emesis.  Patient with mediastinal emphysema.    PMH:  Bipolar disorder, CVA with Lt hemiparesis, OA, esp Rt hip    PT Comments    Spoke with staff at previous ALF and pt was mod I with mobility except supervision with shower as a safety precaution. Pt was amb with rollator and getting on scat bus and amb down road to visit people. Pt now requires modA for bed mobility and assist x 2 for OOB mobility. Pt unable to return to ALF at this time as pt is requiring assist x2. Pt to benefit from ST-SNF prior to transition back to ALF. Pt very focused on having a BM as pt hasn't had one since 11/22. Acute PT to con't to follow.  Follow Up Recommendations  SNF;Supervision/Assistance - 24 hour     Equipment Recommendations  None recommended by PT    Recommendations for Other Services       Precautions / Restrictions Precautions Precautions: Fall Restrictions Weight Bearing Restrictions: No    Mobility  Bed Mobility Overal bed mobility: Needs Assistance Bed Mobility: Rolling;Sidelying to Sit Rolling: Min assist Sidelying to sit: Mod assist       General bed mobility comments: assist for trunk elevation and to bring hips to EOB  Transfers Overall transfer level: Needs assistance Equipment used:  (2 person lift with gait belt and pt pulling up on PT and RN ) Transfers: Sit to/from [employer]Stand;Stand Pivot Transfers Sit to Stand: Mod assist;Max assist;+2 physical assistance;From elevated surface Stand pivot transfers: Mod assist;Max assist;+2 physical assistance       General transfer comment: pt required bed to be very elevated to reach standing. Pt very unsteady during std pvt [facility]. pt with minimal foot clearance.   Ambulation/Gait             General Gait  Details: pt limited to std pvt to chair. attempted to marching in place but pt reports "i can't my back hurts to bad"   Stairs            Wheelchair Mobility    Modified Rankin (Stroke Patients Only)       Balance Overall balance assessment: Needs assistance Sitting-balance support: Bilateral upper extremity supported;Feet supported Sitting balance-Leahy Scale: Poor Sitting balance - Comments: pt with posterior and L lateral lean   Standing balance support: Bilateral upper extremity supported Standing balance-Leahy Scale: Poor Standing balance comment: unable to maintain standing without AD and external support                    Cognition Arousal/Alertness: Lethargic Behavior During Therapy: Flat affect Overall Cognitive Status: No family/caregiver present to determine baseline cognitive functioning                      Exercises      General Comments General comments (skin integrity, edema, etc.): [facility] to have BM however pt unsuccesful      Pertinent Vitals/Pain Pain Assessment: No/denies pain    Home Living                      Prior Function            PT Goals (current goals can now be found in the care plan  section) Acute Rehab PT Goals Patient Stated Goal: go back home Progress towards PT goals: Progressing toward goals    Frequency    Min 2X/week      PT Plan Current plan remains appropriate    Co-evaluation             End of Session Equipment Utilized During Treatment: Gait belt Activity Tolerance: Patient limited by fatigue Patient left: in chair;with call bell/phone within reach;with chair alarm set     Time: 1130-1201 PT Time Calculation (min) (ACUTE ONLY): 31 min  Charges:  $Therapeutic Activity: 23-37 mins                    G Codes:      Dariyah Garduno M Josee Speece 07/20/2016, 12:19 PM   Lewis ShockAshly Zailah Zagami, PT, DPT Pager #: [phone] Office #: [phone]

## 2016-07-20 NOTE — Care Management Note (Signed)
Case Management Note Donn PieriniKristi Deontez Klinke RN, BSN Unit 2W-Case Manager [phone]  Patient Details  Name: Betty HerbertShirley Buchanan MRN: [id number] Date of Birth: [birth year]  Subjective/Objective:  Pt admitted with Pneumomediastinum                    Action/Plan: PTA pt lived [facility]- per PT eval recommendations for SNF level - CSW consulted for placement needs.   Expected Discharge Date:   (unknown)               Expected Discharge Plan:  Skilled Nursing Facility  In-House Referral:  Clinical Social Work  Discharge planning Services  CM Consult  Post Acute Care Choice:    Choice offered to:     DME Arranged:    DME Agency:     HH Arranged:    HH Agency:     Status of Service:  In process, will continue to follow  If discussed at Long Length of Stay Meetings, dates discussed:    Additional Comments:  Darrold SpanWebster, Jariana Shumard Hall, RN 07/20/2016, 10:16 AM

## 2016-07-20 NOTE — Clinical Social Work Note (Signed)
Clinical Social Work Assessment  Patient Details  Name: Betty Buchanan MRN: [id number] Date of Birth: 08/09/1951  Date of referral:  07/20/16               Reason for consult:  Discharge Planning                Permission sought to share information with:  Family Supports Permission granted to share information::     Name::        Agency::     Relationship::     Contact Information:     Housing/Transportation Living arrangements for the past 2 months:  Assisted Living Facility Source of Information:  Patient Patient Interpreter Needed:  None Criminal Activity/Legal Involvement Pertinent to Current Situation/Hospitalization:  No - Comment as needed Significant Relationships:  Adult Children, Siblings Lives with:  Self Do you feel safe going back to the place where you live?  Yes Need for family participation in patient care:  Yes (Comment)  Care giving concerns:  No family/friends at bedside. Patient states she has an adult daughter in the area, as well as a sibling    Social Worker assessment / plan:  Clinical Social Worker met patient at bedside to offer support and discuss patient need at discharge. Patient states that she has been living at [facility]. Patient is agreeable to SNF placement and would like placement in [place]. CSW to complete necessary paperwork and initiate SNF search on patients behalf.CSW to follow up with patient once bed offers are available. CSW remains available for support and to facilitate patient discharge needs once medically stable Employment status:  Retired Insurance information:  Medicaid In State PT Recommendations:  Skilled Nursing Facility Information / Referral to community resources:  Skilled Nursing Facility  Patient/Family's Response to care: Patient verbalized appreciation and understanding for CSW role and involvement in care. Patient agreeable with current discharge plan to SNF following discharge  Patient/Family's Understanding  of and Emotional Response to Diagnosis, Current Treatment, and Prognosis:  Patient with goof understanding of current medical state and limitations around most recent hospitalization. Patient is agreeable with SNF placement in hopes of transitioning back to a more independent living.   Emotional Assessment Appearance:  Appears stated age Attitude/Demeanor/Rapport:  Other (attitude/demeanor appropriate) Affect (typically observed):  Calm, Pleasant Orientation:  Oriented to Place, Oriented to Self, Oriented to Situation, Oriented to  Time Alcohol / Substance use:  Not Applicable Psych involvement (Current and /or in the community):  No (Comment)  Discharge Needs  Concerns to be addressed:  Discharge Planning Concerns Readmission within the last 30 days:  No Current discharge risk:  None Barriers to Discharge:  Barriers Resolved    , MSW,  LCSWA [phone]   

## 2016-07-20 NOTE — Clinical Social Work Placement (Signed)
   CLINICAL SOCIAL WORK PLACEMENT  NOTE  Date:  07/20/2016  Patient Details  Name: Betty Buchanan MRN: [id number] Date of Birth: 11/16/1950  Clinical Social Work is seeking post-discharge placement for this patient at the Skilled  Nursing Facility level of care (*CSW will initial, date and re-position this form in  chart as items are completed):  Yes   Patient/family provided with [place] Clinical Social Work Department's list of facilities offering this level of care within the geographic area requested by the patient (or if unable, by the patient's family).  Yes   Patient/family informed of their freedom to choose among providers that offer the needed level of care, that participate in Medicare, Medicaid or managed care program needed by the patient, have an available bed and are willing to accept the patient.  Yes   Patient/family informed of [place]'s ownership interest in [facility] and [facility], as well as of the fact that they are under no obligation to receive care at these facilities.  PASRR submitted to EDS on       PASRR number received on       Existing PASRR number confirmed on       FL2 transmitted to all facilities in geographic area requested by pt/family on       FL2 transmitted to all facilities within larger geographic area on       Patient informed that his/her managed care company has contracts with or will negotiate with certain facilities, including the following:            Patient/family informed of bed offers received.  Patient chooses bed at       Physician recommends and patient chooses bed at      Patient to be transferred to   on  .  Patient to be transferred to facility by       Patient family notified on   of transfer.  Name of family member notified:        PHYSICIAN Please sign FL2     Additional Comment:    _______________________________________________ Marrianne MoodAshley Anshi Jalloh, MSW,  LCSWA [phone]

## 2016-07-20 NOTE — Discharge Summary (Deleted)
Physician Discharge Summary  Patient ID: Betty Buchanan MRN: [id number] DOB/AGE: 64-Feb-1952 64 y.o.  Admit date: 07/15/2016 Discharge date: 07/21/2016  Admission Diagnoses:  Patient Active Problem List   Diagnosis Date Noted  . Pneumomediastinum (HCC) 07/16/2016   Discharge Diagnoses:   Patient Active Problem List   Diagnosis Date Noted  . Pneumomediastinum (HCC) 07/16/2016   Discharged Condition: good  History of Present Illness:  Betty Buchanan is a 65 yo female with history of Bipolar disorder, H/O stroke with left sided hemiplegia, GERD, pancreatitis, and nicotine abuse.  [facility] with complaints of vomiting, back and abdominal pain, rectal bleeding constipation and chills.  CT of the chest was obtained and showed a large pneumomediastinum without pneumothorax.  There was also evidence of retroperitoneal air without free air.  There was no evidence of esophageal leak.  CT surgery consult was obtained who transferred the [facility] to [facility].  Hospital Course:   The patient required placement of a foley catheter for urinary retention.  She was made NPO except sips of water for medication administration.  She underwent gastrograffin swallow which confirmed no esophageal perforation was present. She was treated with ABX, however her cultures were negative throughout her stay.  She was transferred to the telemetry unit for further care on POD #2.  Her diet was slowly advanced as tolerated.  Foley removal was attempted, however she was continued to be unable to void and required replacement of foley catheter.  Urinalysis was obtained and was negative.  She was treated with laxatives for constipation.       Significant Diagnostic Studies: radiology:   CT scan:  1. Small amount of intraluminal contrast present within the esophagus. No extravasation of contrast/extraluminal contrast present within the mediastinum. Contrast is also present  within the stomach and there is no extraluminal contrast visualized in the upper abdomen. There is no significant pleural effusion. 2. Extensive pneumomediastinum again visualized. Small amount of pneumoperitoneum partially visualized.  DG Esophagus 1. Focused, limited exam, as detailed above. 2. No evidence of esophageal perforation. 3. 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  Disposition: SNF, [facility]  Discharge Medications:     Medication List    TAKE these medications   acetaminophen 500 MG tablet Commonly known as:  TYLENOL Take 500 mg by mouth every 6 (six) hours as needed for mild pain, moderate pain or fever. Do not exceed 3gm/24h What changed:  Another medication with the same name was removed. Continue taking this medication, and follow the directions you see here.   aspirin EC 81 MG tablet Take 81 mg by mouth daily with breakfast.   atorvastatin 40 MG tablet Commonly known as:  LIPITOR Take 40 mg by mouth every evening.   diclofenac sodium 1 % Gel Commonly known as:  VOLTAREN Apply 4 g topically 4 (four) times daily.   divalproex 125 MG capsule Commonly known as:  DEPAKOTE SPRINKLE Take 500-1,000 mg by mouth 2 (two) times daily. Takes 500mg  in the morning and 1000mg  at night   LORazepam 1 MG tablet Commonly known as:  ATIVAN Take 1 mg by mouth every 4 (four) hours as needed for anxiety (and agitation).   LORazepam 0.5 MG tablet Commonly known as:  ATIVAN Take 0.5 mg by mouth daily.   Melatonin 1 MG Tabs Take 2 mg by mouth at bedtime.   multivitamin with minerals Tabs tablet Take 1 tablet by mouth daily.   OLANZapine 10 MG tablet Commonly known as:  ZYPREXA Take 10 mg by mouth at bedtime. Take with 30 mg to get 40 mg dose   OLANZapine 15 MG tablet Commonly known as:  ZYPREXA Take 30 mg by mouth at bedtime. Take with 10 mg dose to get 40 mg total   pantoprazole 40 MG tablet Commonly known as:  PROTONIX Take 40 mg by  mouth daily with breakfast.   solifenacin 10 MG tablet Commonly known as:  VESICARE Take 10 mg by mouth daily.   thiothixene 10 MG capsule Commonly known as:  NAVANE Take 10 mg by mouth 2 (two) times daily.      Follow-up Information    Kathlee NationsPeter Van Trigt III, MD Follow up on 08/12/2016.   Specialty:  Cardiothoracic Surgery Why:  Appointment is at 4:30, please get CXR at 4:00 at [facility] located on first floor of our office building Contact information: [address] [place] [address] [phone]           Signed: Sharlene Doryessa N Durante Violett 07/21/2016, 1:23 PM

## 2016-07-20 NOTE — Progress Notes (Addendum)
      [address]       Betty Buchanan [id number]             [phone]         Subjective: C/o back pain, no recent BM  Objective: Vital signs in last 24 hours: Temp:  [98.5 F (36.9 C)-99.1 F (37.3 C)] 98.5 F (36.9 C) (11/27 0607) Pulse Rate:  [62-98] 62 (11/27 0607) Cardiac Rhythm: Normal sinus rhythm (11/26 2015) Resp:  [16-18] 16 (11/27 0607) BP: (94-157)/(49-82) 107/49 (11/27 0607) SpO2:  [98 %-100 %] 98 % (11/27 0607) Weight:  [255 lb 6.4 oz (115.8 kg)] 255 lb 6.4 oz (115.8 kg) (11/27 56210607)  Hemodynamic parameters for last 24 hours:    Intake/Output from previous day: 11/26 0701 - 11/27 0700 In: 570 [P.O.:570] Out: 1800 [Urine:1800] Intake/Output this shift: No intake/output data recorded.  General appearance: alert, cooperative and no distress Heart: regular rate and rhythm Lungs: dim in bases Abdomen: mod dist, + BS Extremities: no edema  Lab Results:  Recent Labs  07/18/16 0406  WBC 10.3  HGB 10.0*  HCT 31.2*  PLT 205   BMET:  Recent Labs  07/18/16 0406  NA 136  K 3.8  CL 103  CO2 25  GLUCOSE 110*  BUN 7  CREATININE 0.75  CALCIUM 9.1    PT/INR: No results for input(s): LABPROT, INR in the last 72 hours. ABG    Component Value Date/Time   PHART 7.388 07/16/2016 0530   HCO3 23.4 07/16/2016 0530   TCO2 25 07/16/2016 0530   ACIDBASEDEF 1.0 07/16/2016 0530   O2SAT 90.0 07/16/2016 0530   CBG (last 3)  No results for input(s): GLUCAP in the last 72 hours.  Meds Scheduled Meds: . aspirin EC  81 mg Oral Daily  . atorvastatin  40 mg Oral q1800  . chlorhexidine  15 mL Mouth Rinse BID  . Chlorhexidine Gluconate Cloth  6 each Topical Q0600  . diclofenac sodium  4 g Topical QID  . divalproex  1,000 mg Oral QHS  . divalproex  500 mg Oral Daily  . enoxaparin (LOVENOX) injection  30 mg Subcutaneous Q24H  . famotidine  20 mg Oral BID  . levofloxacin  750 mg Oral QHS  . mouth rinse  15 mL Mouth Rinse q12n4p  . mupirocin  ointment  1 application Nasal BID  . OLANZapine  30 mg Oral QHS  . sodium chloride flush  3 mL Intravenous Q12H  . thiamine  100 mg Oral Daily  . thiothixene  10 mg Oral BID   Continuous Infusions: . dextrose 5 % and 0.45% NaCl 30 mL/hr at 07/17/16 2300   PRN Meds:.acetaminophen, diphenhydrAMINE, [DISCONTINUED] ondansetron **OR** ondansetron (ZOFRAN) IV  Xrays No results found.  Assessment/Plan:  1 stable, no new labs- cont levofloxin, BP is quite variable 2 add laxative 3 foley replaced for retention 4 back to facility soon  LOS: 4 days    GOLD,[name] 07/20/2016  patient examined and medical record reviewed,agree with above note. Patient stable, pneumomediastinum improved Transfer to [facility] when bed available Betty Buchanan 07/20/2016

## 2016-07-20 NOTE — Discharge Instructions (Signed)
Pneumomediastinum Pneumomediastinum occurs when air leaks into the mediastinum. The mediastinum is the area between the lungs, just behind the breastbone. This space contains the heart, aorta, venae cavae, esophagus, and thymus. CAUSES  Pneumomediastinum can occur on its own (spontaneous), or it can occur following an injury (traumatic). Pneumomediastinum is usually caused by a condition or injury that forces air to leak out of the lungs and into the mediastinum. Common conditions and injuries that lead to pneumomediastinum include:  Childbirth.  Chest or abdominal injuries, such as blunt or penetrating trauma.  Asthma.  Problems during scuba diving.  Problems during the use of a breathing machine (ventilator).  Inhaling illegal drugs or chemicals.  Infections in the face, neck, chest, or abdomen.  Extreme strain during coughing or vomiting.  A hole in the intestine or esophagus.  Ingesting a corrosive solvent.  Accidentally making a hole in the lung or intestine during surgery or a medical procedure.  Accidentally breathing an object into the airway. SYMPTOMS  In some cases, there may be no symptoms. A lack of symptoms is more likely with spontaneous pneumomediastinum. When symptoms are present, they may include:  Chest pain, which may run into the neck, shoulder, back, or arms.  Increased pain when moving, swallowing, or taking a deep breath.  Problems swallowing.  Problems speaking.  Vocal changes.  Shortness of breath.  Fever.  Throat or jaw pain. DIAGNOSIS  Your health care provider may suspect pneumomediastinum based on your symptoms and a physical exam. If needed, imaging tests may be done such as a chest X-ray or CT. TREATMENT  Spontaneous pneumomediastinum often gets better without any treatment. Your body will slowly reabsorb the air. In more severe cases, treatment is directed at addressing the cause. This may include antibiotic medicines to treat an  infection. In very severe cases, the air may build up and start to put pressure on the heart or lungs. In this case, you will need to stay in the hospital. One of the following procedures may be needed:  Needle aspiration. This procedure uses a needle that is inserted into the mediastinum to take out the trapped air.  Chest tube placement. This may be done if you have a collapsed lung.  Surgery to repair a hole in the intestine or esophagus. HOME CARE INSTRUCTIONS   Until your health care provider says it is okay, avoid:  Air travel.  Scuba diving.  High altitudes.  Hard physical work.  Exercise.  Do not use any tobacco products including cigarettes, chewing tobacco, or electronic cigarettes.  Do not use illegal drugs.  Only take medicine as directed by your health care provider.  If you have had surgery, a chest tube placed, or a needle aspiration, watch for drainage, redness, swelling, or pain at any incision or puncture sites. Contact your health care provider if you notice any of these symptoms. SEEK MEDICAL CARE IF: You have a fever. SEEK IMMEDIATE MEDICAL CARE IF:  You have worsening pain in the chest, neck, jaw, or arms.  You have trouble breathing.  You have new problems with speaking or swallowing. MAKE SURE YOU:  Understand these instructions.  Will watch your condition.  Will get help right away if you are not doing well or get worse. This information is not intended to replace advice given to you by your health care provider. Make sure you discuss any questions you have with your health care provider. Document Released: 07/23/2008 Document Revised: 08/15/2013 Document Reviewed: 06/07/2015 Elsevier Interactive Patient Education  2017 [place].

## 2016-07-21 MED ORDER — DICLOFENAC SODIUM 1 % TD GEL: 4.0000 g | Freq: Four times a day (QID) | TRANSDERMAL | 0 refills | Status: DC

## 2016-07-21 MED ORDER — OLANZAPINE 15 MG PO TABS: 30.0000 mg | ORAL_TABLET | Freq: Every day | ORAL | 1 refills | Status: DC

## 2016-07-21 NOTE — [place] FL2 (Signed)
[place] MEDICAID FL2 LEVEL OF CARE SCREENING TOOL     IDENTIFICATION  Patient Name: Betty Buchanan Birthdate: 06/15/1951 Sex: female Admission Date (Current Location): 07/15/2016  [facility] [place]   Facility and Address:  [facility], [address], [place], [place] [address]      Provider Number: [id number]  Attending Physician Name and Address:  Kerin PernaPeter Van Trigt, MD  Relative Name and Phone Number:       Current Level of Care: Hospital Recommended Level of Care: Skilled Nursing Facility Prior Approval Number:    Date Approved/Denied:   PASRR Number:    Discharge Plan: SNF    Current Diagnoses: Patient Active Problem List   Diagnosis Date Noted  . Pneumomediastinum (HCC) 07/16/2016    Orientation RESPIRATION BLADDER Height & Weight     Time, Situation, Place, Self  Normal Continent Weight: 252 lb 12.8 oz (114.7 kg) Height:  5\' 7"  (170.2 cm)  BEHAVIORAL SYMPTOMS/MOOD NEUROLOGICAL BOWEL NUTRITION STATUS      Continent Diet (soft diet, thin liqueds )  AMBULATORY STATUS COMMUNICATION OF NEEDS Skin   Extensive Assist Verbally Normal                       Personal Care Assistance Level of Assistance  Feeding, Dressing, Bathing Bathing Assistance: Limited assistance Feeding assistance: Independent Dressing Assistance: Limited assistance     Functional Limitations Info  Sight Sight Info: Adequate Hearing Info: Adequate Speech Info: Adequate    SPECIAL CARE FACTORS FREQUENCY  PT (By licensed PT), OT (By licensed OT)     PT Frequency: 3x week OT Frequency: 3x week            Contractures Contractures Info: Not present    Additional Factors Info  Code Status, Allergies, Psychotropic Code Status Info: Full Code Allergies Info: PENICILLINS  Psychotropic Info: Divalproex, lorazepam, olanzapine, thiothixene,          Current Medications (07/21/2016):  This is the current hospital active  medication list Current Facility-Administered Medications  Medication Dose Route Frequency Provider Last Rate Last Dose  . acetaminophen (TYLENOL) tablet 650 mg  650 mg Oral Q6H PRN Alleen BorneBryan K Bartle, MD   650 mg at 07/20/16 1513  . aspirin EC tablet 81 mg  81 mg Oral Daily Kerin PernaPeter Van Trigt, MD   81 mg at 07/21/16 1041  . atorvastatin (LIPITOR) tablet 40 mg  40 mg Oral q1800 Kerin PernaPeter Van Trigt, MD   40 mg at 07/20/16 1723  . bisacodyl (DULCOLAX) suppository 10 mg  10 mg Rectal Daily PRN Rowe ClackWayne E Gold, PA-C   10 mg at 07/20/16 1435  . chlorhexidine (PERIDEX) 0.12 % solution 15 mL  15 mL Mouth Rinse BID Kerin PernaPeter Van Trigt, MD   15 mL at 07/20/16 2207  . dextrose 5 %-0.45 % sodium chloride infusion   Intravenous Continuous Kerin PernaPeter Van Trigt, MD 30 mL/hr at 07/17/16 2300    . diclofenac sodium (VOLTAREN) 1 % transdermal gel 4 g  4 g Topical QID Alleen BorneBryan K Bartle, MD   4 g at 07/21/16 1042  . diphenhydrAMINE (BENADRYL) injection 6.5 mg  6.5 mg Intravenous Q6H PRN Kerin PernaPeter Van Trigt, MD   6.5 mg at 07/17/16 1422  . divalproex (DEPAKOTE SPRINKLE) capsule 1,000 mg  1,000 mg Oral QHS Kerin PernaPeter Van Trigt, MD   1,000 mg at 07/20/16 2208  . divalproex (DEPAKOTE SPRINKLE) capsule 500 mg  500 mg Oral Daily Alleen BorneBryan K Bartle,  MD   500 mg at 07/21/16 1042  . enoxaparin (LOVENOX) injection 30 mg  30 mg Subcutaneous Q24H Kerin PernaPeter Van Trigt, MD   30 mg at 07/20/16 1435  . famotidine (PEPCID) tablet 20 mg  20 mg Oral BID Silvana Newnessndrew D Meyer, RPH   20 mg at 07/21/16 1042  . lactulose (CHRONULAC) 10 GM/15ML solution 20 g  20 g Oral Daily PRN Rowe ClackWayne E Gold, PA-C   20 g at 07/20/16 [id number]  . levofloxacin (LEVAQUIN) tablet 750 mg  750 mg Oral QHS Sharlene Doryessa N Conte, PA-C   750 mg at 07/20/16 2208  . MEDLINE mouth rinse  15 mL Mouth Rinse q12n4p Kerin PernaPeter Van Trigt, MD   15 mL at 07/18/16 1534  . OLANZapine (ZYPREXA) tablet 30 mg  30 mg Oral QHS Kerin PernaPeter Van Trigt, MD   30 mg at 07/20/16 2209  . ondansetron (ZOFRAN) injection 4 mg  4 mg Intravenous Q6H PRN Kerin PernaPeter Van  Trigt, MD      . sodium chloride flush (NS) 0.9 % injection 3 mL  3 mL Intravenous Q12H Kerin PernaPeter Van Trigt, MD   3 mL at 07/21/16 1043  . thiamine (VITAMIN B-1) tablet 100 mg  100 mg Oral Daily Silvana Newnessndrew D Meyer, RPH   100 mg at 07/21/16 1043  . thiothixene (NAVANE) capsule 10 mg  10 mg Oral BID Kerin PernaPeter Van Trigt, MD   10 mg at 07/21/16 1043     Discharge Medications: Please see discharge summary for a list of discharge medications.  Relevant Imaging Results:  Relevant Lab Results:   Additional Information SS#: [id number]  Althea CharonAshley C Joven Mom, LCSW

## 2016-07-21 NOTE — Care Management Note (Signed)
Case Management Note Donn PieriniKristi Cedra Villalon RN, BSN Unit 2W-Case Manager [phone]  Patient Details  Name: Betty HerbertShirley Thrall MRN: [id number] Date of Birth: July 17, 1951  Subjective/Objective:  Pt admitted with Pneumomediastinum                    Action/Plan: PTA pt lived at [facility]- per PT eval recommendations for SNF level - CSW consulted for placement needs.   Expected Discharge Date:  07/21/16         Expected Discharge Plan:  Skilled Nursing Facility  In-House Referral:  Clinical Social Work  Discharge planning Services  CM Consult  Post Acute Care Choice:    Choice offered to:     DME Arranged:    DME Agency:     HH Arranged:    HH Agency:     Status of Service:  Completed, signed off  If discussed at [employer] Meetings, dates discussed:    Discharge Disposition: skilled facility   Additional Comments:   07/21/16- 1355- Alyce Inscore RN,CM- pt for d/c today- plan is for SNF, CSW following for placement needs.   Darrold SpanWebster, Kiarah Eckstein Hall, RN 07/21/2016, 1:56 PM

## 2016-07-21 NOTE — Progress Notes (Addendum)
      [address]       [place] [id number]             [phone]         Subjective: Shares she did have a bowel movement yesterday.   Objective: Vital signs in last 24 hours: Temp:  [97.8 F (36.6 C)-98.4 F (36.9 C)] 97.8 F (36.6 C) (11/28 0357) Pulse Rate:  [60-68] 60 (11/28 0357) Cardiac Rhythm: Normal sinus rhythm (11/27 2000) Resp:  [16-18] 18 (11/28 0357) BP: (117-124)/(70-75) 124/75 (11/28 0357) SpO2:  [99 %-100 %] 99 % (11/28 0357) Weight:  [252 lb 12.8 oz (114.7 kg)] 252 lb 12.8 oz (114.7 kg) (11/28 0357)     Intake/Output from previous day: 11/27 0701 - 11/28 0700 In: 1200 [P.O.:1200] Out: 1900 [Urine:1900] Intake/Output this shift: No intake/output data recorded.  General appearance: alert, cooperative and no distress Heart: regular rate and rhythm Lungs: clear to auscultation bilaterally Abdomen: soft, non-tender; bowel sounds normal; no masses,  no organomegaly Extremities: extremities normal, atraumatic, no cyanosis or edema Wound: N/A  Lab Results: No results for input(s): WBC, HGB, HCT, PLT in the last 72 hours. BMET: No results for input(s): NA, K, CL, CO2, GLUCOSE, BUN, CREATININE, CALCIUM in the last 72 hours.  PT/INR: No results for input(s): LABPROT, INR in the last 72 hours. ABG    Component Value Date/Time   PHART 7.388 07/16/2016 0530   HCO3 23.4 07/16/2016 0530   TCO2 25 07/16/2016 0530   ACIDBASEDEF 1.0 07/16/2016 0530   O2SAT 90.0 07/16/2016 0530   CBG (last 3)  No results for input(s): GLUCAP in the last 72 hours.  Assessment/Plan:  1. Stable, no new labs-continue Levofloxin 2. + BM yesterday 3. Foley in place for urinary retention, weight continues to trend down 4. Continue ambulation  Plan: Dis[facility] when bed available.     LOS: 5 days    Sharlene Doryessa N Conte 07/21/2016 patient examined and medical record reviewed,agree with above note. DC antibiotics  Kathlee Nationseter Van Trigt  III 07/21/2016

## 2016-07-22 NOTE — Clinical Social Work Note (Signed)
Clinical Social Worker continuing to follow patient for support and discharge planning needs.  Patient has [facility] and insurance authorization has been started.  Per facility, patient has additional insurance coverage through [employer] and authorization has been initiated.  Patient to discharge to [facility] tomorrow (11/30).  Paged PA to update on current discharge plan.  CSW remains available for support and to facilitate patient discharge needs.  Macario GoldsJesse Tanaisha Pittman, [place] [phone]

## 2016-07-22 NOTE — Progress Notes (Signed)
      [address]       [place] [id number]             [phone]         Subjective: Feels okay. Wants to walk more.   Objective: Vital signs in last 24 hours: Temp:  [97.9 F (36.6 C)-98.3 F (36.8 C)] 98.1 F (36.7 C) (11/29 0446) Pulse Rate:  [54-71] 54 (11/29 0446) Cardiac Rhythm: Normal sinus rhythm (11/28 1900) Resp:  [18] 18 (11/29 0446) BP: (112-131)/(66-77) 112/66 (11/29 0446) SpO2:  [98 %-100 %] 99 % (11/29 0446) Weight:  [254 lb 6.6 oz (115.4 kg)] 254 lb 6.6 oz (115.4 kg) (11/29 0446)     Intake/Output from previous day: 11/28 0701 - 11/29 0700 In: 840 [P.O.:840] Out: 1050 [Urine:1050] Intake/Output this shift: No intake/output data recorded.  General appearance: cooperative Heart: regular rate and rhythm Lungs: clear to auscultation bilaterally Abdomen: soft, non-tender; bowel sounds normal; no masses,  no organomegaly Extremities: extremities normal, atraumatic, no cyanosis or edema  Lab Results: No results for input(s): WBC, HGB, HCT, PLT in the last 72 hours. BMET: No results for input(s): NA, K, CL, CO2, GLUCOSE, BUN, CREATININE, CALCIUM in the last 72 hours.  PT/INR: No results for input(s): LABPROT, INR in the last 72 hours. ABG    Component Value Date/Time   PHART 7.388 07/16/2016 0530   HCO3 23.4 07/16/2016 0530   TCO2 25 07/16/2016 0530   ACIDBASEDEF 1.0 07/16/2016 0530   O2SAT 90.0 07/16/2016 0530   CBG (last 3)  No results for input(s): GLUCAP in the last 72 hours.  Assessment/Plan:  1. Stable, no new labs-stop antibiotic 2. + BM  3. Foley removed yesterday. Wet the bed overnight, but is able to voice the need to void 4. Continue ambulation  Plan: [facility]   LOS: 6 days    Sharlene Doryessa N Gerren Hoffmeier 07/22/2016

## 2016-07-22 NOTE — Progress Notes (Signed)
Physical Therapy Treatment Patient Details Name: Betty HerbertShirley Buchanan MRN: [id number] DOB: [birth year] Today's Date: 07/22/2016    History of Present Illness Patient is a 65 yo female admitted 07/15/16 with abdominal pain and emesis.  Patient with mediastinal emphysema.    PMH:  Bipolar disorder, CVA with Lt hemiparesis, OA, esp Rt hip    PT Comments    Pt progressing well towards all goals. Pt tolerated amb with RW 120' but required freq rest breaks and had difficulty clearing her feet. Cont' to recommend ST-SNF upon d/c to progress to mod I function for safe transition home to [facility].  Follow Up Recommendations  SNF;Supervision/Assistance - 24 hour     Equipment Recommendations  None recommended by PT    Recommendations for Other Services       Precautions / Restrictions Precautions Precautions: Fall Restrictions Weight Bearing Restrictions: No    Mobility  Bed Mobility               General bed mobility comments: pt on BSC upon PT arrival  Transfers Overall transfer level: Needs assistance Equipment used: Rolling walker (2 wheeled) Transfers: Sit to/from Stand Sit to Stand: Min assist;Mod assist         General transfer comment: v/c's for safe hand placement, min/modA for intial power up  Ambulation/Gait Ambulation/Gait assistance: Min assist;+2 safety/equipment (w/c follow) Ambulation Distance (Feet): 120 Feet Assistive device: Rolling walker (2 wheeled) Gait Pattern/deviations: Step-through pattern;Decreased step length - right;Decreased stance time - left;Wide base of support Gait velocity: slow   General Gait Details: pt with freq standing rest breaks. pt with decreased ability to clear R foot constistantly   Stairs            Wheelchair Mobility    Modified Rankin (Stroke Patients Only)       Balance Overall balance assessment: Needs assistance Sitting-balance support: Feet supported;No upper extremity supported Sitting  balance-Leahy Scale: Fair     Standing balance support: Bilateral upper extremity supported Standing balance-Leahy Scale: Poor Standing balance comment: requires use of RW                    Cognition Arousal/Alertness: Awake/alert Behavior During Therapy: WFL for tasks assessed/performed Overall Cognitive Status: Impaired/Different from baseline                      Exercises      General Comments General comments (skin integrity, edema, etc.): pt dependent for hygiene s/p urination      Pertinent Vitals/Pain Pain Assessment: No/denies pain    Home Living                      Prior Function            PT Goals (current goals can now be found in the care plan section) Acute Rehab PT Goals Patient Stated Goal: [facility] Progress towards PT goals: Progressing toward goals    Frequency    Min 2X/week      PT Plan Current plan remains appropriate    Co-evaluation             End of Session Equipment Utilized During Treatment: Gait belt Activity Tolerance: Patient limited by fatigue Patient left: in chair;with call bell/phone within reach;with chair alarm set     Time: [id number] PT Time Calculation (min) (ACUTE ONLY): 19 min  Charges:  $Gait Training: 8-22 mins  G CodesRozell Searing:      Benito Lemmerman M Angelica Wix 07/22/2016, 3:07 PM   Lewis ShockAshly Chauncey Bruno, PT, DPT Pager #: [phone] Office #: [phone]

## 2016-07-23 LAB — CREATININE, SERUM
Creatinine, Ser: 0.89 mg/dL (ref 0.44–1.00)
GFR calc Af Amer: >60 mL/min (ref >60)
GFR calc non Af Amer: >60 mL/min (ref >60)

## 2016-07-23 NOTE — Discharge Summary (Signed)
Physician Discharge Summary  Patient ID: Betty HerbertShirley Buchanan MRN: [id number] DOB/AGE: 05-04-1951 65 y.o.  Admit date: 07/15/2016 Discharge date: 07/23/2016  Admission Diagnoses:  Patient Active Problem List   Diagnosis Date Noted  . Pneumomediastinum (HCC) 07/16/2016   Discharge Diagnoses:   Patient Active Problem List   Diagnosis Date Noted  . Pneumomediastinum (HCC) 07/16/2016   Discharged Condition: good  History of Present Illness:  Ms. Betty BushyBooker is a 65 yo female with history of Bipolar disorder, H/O stroke with left sided hemiplegia, GERD, pancreatitis, and nicotine abuse.  [facility] with complaints of vomiting, back and abdominal pain, rectal bleeding constipation and chills.  CT of the chest was obtained and showed a large pneumomediastinum without pneumothorax.  There was also evidence of retroperitoneal air without free air.  There was no evidence of esophageal leak.  CT surgery consult was obtained who [facility].  Hospital Course:   The patient required placement of a foley catheter for urinary retention.  She was made NPO except sips of water for medication administration.  She underwent gastrograffin swallow which confirmed no esophageal perforation was present. She was treated with ABX, however her cultures were negative throughout her stay.  She was transferred to the telemetry unit for further care on POD #2.  Her diet was slowly advanced as tolerated.  Foley removal was attempted, however she was continued to be unable to void and required replacement of foley catheter.  Urinalysis was obtained and was negative.  She was treated with laxatives for constipation. She is now urinating on her own. She was kept in the hospital for an extra few days to wait for a bed at her facility and for insurance clearance. She is now ready to be transferred [facility].    Significant Diagnostic Studies: radiology:    CT scan:  1. Small amount of intraluminal contrast present within the esophagus. No extravasation of contrast/extraluminal contrast present within the mediastinum. Contrast is also present within the stomach and there is no extraluminal contrast visualized in the upper abdomen. There is no significant pleural effusion. 2. Extensive pneumomediastinum again visualized. Small amount of pneumoperitoneum partially visualized.  DG Esophagus 1. Focused, limited exam, as detailed above. 2. No evidence of esophageal perforation. 3. 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  Disposition: SNF[facility]  Discharge Medications:     Medication List    TAKE these medications   acetaminophen 500 MG tablet Commonly known as:  TYLENOL Take 500 mg by mouth every 6 (six) hours as needed for mild pain, moderate pain or fever. Do not exceed 3gm/24h What changed:  Another medication with the same name was removed. Continue taking this medication, and follow the directions you see here.   aspirin EC 81 MG tablet Take 81 mg by mouth daily with breakfast.   atorvastatin 40 MG tablet Commonly known as:  LIPITOR Take 40 mg by mouth every evening.   diclofenac sodium 1 % Gel Commonly known as:  VOLTAREN Apply 4 g topically 4 (four) times daily.   divalproex 125 MG capsule Commonly known as:  DEPAKOTE SPRINKLE Take 500-1,000 mg by mouth 2 (two) times daily. Takes 500mg  in the morning and 1000mg  at night   LORazepam 1 MG tablet Commonly known as:  ATIVAN Take 1 mg by mouth every 4 (four) hours as needed for anxiety (and agitation).   LORazepam 0.5 MG tablet Commonly known as:  ATIVAN Take 0.5 mg by  mouth daily.   Melatonin 1 MG Tabs Take 2 mg by mouth at bedtime.   multivitamin with minerals Tabs tablet Take 1 tablet by mouth daily.   OLANZapine 10 MG tablet Commonly known as:  ZYPREXA Take 10 mg by mouth at bedtime. Take with 30 mg to get 40 mg dose    OLANZapine 15 MG tablet Commonly known as:  ZYPREXA Take 30 mg by mouth at bedtime. Take with 10 mg dose to get 40 mg total   pantoprazole 40 MG tablet Commonly known as:  PROTONIX Take 40 mg by mouth daily with breakfast.   solifenacin 10 MG tablet Commonly known as:  VESICARE Take 10 mg by mouth daily.   thiothixene 10 MG capsule Commonly known as:  NAVANE Take 10 mg by mouth 2 (two) times daily.      Follow-up Information    Kathlee NationsPeter Van Trigt III, MD Follow up on 08/12/2016.   Specialty:  Cardiothoracic Surgery Why:  Appointment is at 4:30, please [facility] located on first floor of our office building Contact information: [address] [place] [address] [phone]           Signed: Sharlene Doryessa N Karlisa Gaubert 07/23/2016, 10:48 AM

## 2016-07-23 NOTE — Progress Notes (Signed)
Patient discharged to [facility]. Thomas HoffBurton, Reginald Weida McClintock, RN

## 2016-07-23 NOTE — Progress Notes (Signed)
Clinical Social Worker facilitated patient discharge including contacting patient family and facility to confirm patient discharge plans.  Clinical information faxed to facility and family agreeable with plan.  CSW arranged ambulance transport via [facility] to [facility]  .  RN Dois DavenportSandra to call report prior to discharge.  Clinical Social Worker will sign off for now as social work intervention is no longer needed. Please consult us again if new need arises.  Marrianne MoodAshley Adamaris King, MSW, [employer]WA [phone]

## 2016-07-23 NOTE — Progress Notes (Signed)
      [address]       [place] [id number]             [phone]         Subjective: No issues this morning. Enjoying her breakfast.   Objective: Vital signs in last 24 hours: Temp:  [98.2 F (36.8 C)-98.6 F (37 C)] 98.2 F (36.8 C) (11/30 0327) Pulse Rate:  [55-71] 55 (11/30 0327) Cardiac Rhythm: Normal sinus rhythm (11/29 1900) Resp:  [18] 18 (11/30 0327) BP: (122-132)/(63-80) 122/63 (11/30 0327) SpO2:  [98 %-100 %] 98 % (11/30 0327) Weight:  [208 lb 4.8 oz (94.5 kg)] 208 lb 4.8 oz (94.5 kg) (11/30 0327)  Hemodynamic parameters for last 24 hours:    Intake/Output from previous day: 11/29 0701 - 11/30 0700 In: 360 [P.O.:360] Out: 301 [Urine:301] Intake/Output this shift: No intake/output data recorded.  General appearance: cooperative Heart: regular rate and rhythm Lungs: clear to auscultation bilaterally Abdomen: soft, non-tender; bowel sounds normal; no masses,  no organomegaly Extremities: extremities normal, atraumatic, no cyanosis or edema  Lab Results: No results for input(s): WBC, HGB, HCT, PLT in the last 72 hours. BMET:  Recent Labs  07/23/16 0453  CREATININE 0.89    PT/INR: No results for input(s): LABPROT, INR in the last 72 hours. ABG    Component Value Date/Time   PHART 7.388 07/16/2016 0530   HCO3 23.4 07/16/2016 0530   TCO2 25 07/16/2016 0530   ACIDBASEDEF 1.0 07/16/2016 0530   O2SAT 90.0 07/16/2016 0530   CBG (last 3)  No results for input(s): GLUCAP in the last 72 hours.  Assessment/Plan:  1. Stable, no new labs-stop antibiotic 2. + BM  3. Foley out. Voiding.  4. Continue ambulation  Plan: [facility]charge [facility] Today. Follow-up chest xray on 12/20 in the office.    LOS: 7 days    Sharlene Doryessa N Twylla Arceneaux 07/23/2016

## 2016-08-10 ENCOUNTER — Other Ambulatory Visit: Payer: Self-pay | Admitting: Cardiothoracic Surgery

## 2016-08-10 DIAGNOSIS — J982 Interstitial emphysema: Secondary | ICD-10-CM

## 2016-08-12 ENCOUNTER — Ambulatory Visit
Admission: RE | Admit: 2016-08-12 | Discharge: 2016-08-12 | Disposition: A | Discharge status: Home / Self Care | Payer: Medicare HMO | Source: Ambulatory Visit | Attending: Cardiothoracic Surgery | Admitting: Cardiothoracic Surgery

## 2016-08-12 ENCOUNTER — Encounter: Payer: Self-pay | Admitting: Cardiothoracic Surgery

## 2016-08-12 ENCOUNTER — Ambulatory Visit: Payer: Medicare HMO | Admitting: Cardiothoracic Surgery

## 2016-08-12 ENCOUNTER — Ambulatory Visit (INDEPENDENT_AMBULATORY_CARE_PROVIDER_SITE_OTHER): Payer: Medicare HMO | Admitting: Cardiothoracic Surgery

## 2016-08-12 VITALS — BP 122/57 | HR 64 | Resp 16 | Ht 67.0 in | Wt 208.0 lb

## 2016-08-12 DIAGNOSIS — J982 Interstitial emphysema: Secondary | ICD-10-CM

## 2016-08-12 NOTE — Progress Notes (Signed)
PCP is PROVIDER NOT IN SYSTEM Referring Provider is Lorre NickAllen, Anthony, MD  Chief Complaint  Patient presents with  . Follow-up    CXR ,pneumomediastinum     HPI: Posthospitalization follow-up for pneumomediastinum 65 year old AA female active smoker with bipolar disorder lives at [facility] was admitted through the emergency room earlier this month with chest pain and pneumomediastinum probably related to COPD. There is no pneumothorax. There is no evidence of esophageal leak by swallow study. She had altered mental status and weakness. She was eventually improved and return back to her assisted living facility.  She presents today with improved mental status breathing comfortably on room air with saturation 97%. Chest x-ray today is clear. She admits to smoking 5-10 cigarettes a day. She denies recurrent chest pain or difficulty with shortness of breath  Past Medical History:  Diagnosis Date  . Bipolar 1 disorder (HCC)   . GERD (gastroesophageal reflux disease)   . Hemiplegia (HCC)    L side  . Osteoarthritis   . Stroke [facility](HCC)     Past Surgical History:  Procedure Laterality Date  . KNEE ARTHROSCOPY      Family History  Problem Relation Age of Onset  . Cancer Father   . Stroke Father     Social History Social History  Substance Use Topics  . Smoking status: Current Every Day Smoker    Types: Cigarettes  . Smokeless tobacco: Never Used  . Alcohol use No    Current Outpatient Prescriptions  Medication Sig Dispense Refill  . acetaminophen (TYLENOL) 500 MG tablet Take 500 mg by mouth every 6 (six) hours as needed for mild pain, moderate pain or fever. Do not exceed 3gm/24h    . aspirin EC 81 MG tablet Take 81 mg by mouth daily with breakfast.    . atorvastatin (LIPITOR) 40 MG tablet Take 40 mg by mouth every evening.    . diclofenac sodium (VOLTAREN) 1 % GEL Apply 4 g topically 4 (four) times daily. 100 g 0  . divalproex (DEPAKOTE SPRINKLE) 125 MG capsule  Take 500-1,000 mg by mouth 2 (two) times daily. Takes 500mg  in the morning and 1000mg  at night    . LORazepam (ATIVAN) 0.5 MG tablet Take 0.5 mg by mouth daily.    Marland Kitchen. LORazepam (ATIVAN) 1 MG tablet Take 1 mg by mouth every 4 (four) hours as needed for anxiety (and agitation).    . Melatonin 1 MG TABS Take 2 mg by mouth at bedtime.    . Multiple Vitamin (MULTIVITAMIN WITH MINERALS) TABS tablet Take 1 tablet by mouth daily.    Marland Kitchen. OLANZapine (ZYPREXA) 10 MG tablet Take 10 mg by mouth at bedtime. Take with 30 mg to get 40 mg dose    . OLANZapine (ZYPREXA) 15 MG tablet Take 30 mg by mouth at bedtime. Take with 10 mg dose to get 40 mg total    . pantoprazole (PROTONIX) 40 MG tablet Take 40 mg by mouth daily with breakfast.    . solifenacin (VESICARE) 10 MG tablet Take 10 mg by mouth daily.    Marland Kitchen. thiothixene (NAVANE) 10 MG capsule Take 10 mg by mouth 2 (two) times daily.     No current facility-administered medications for this visit.     Allergies  Allergen Reactions  . Penicillins     Unknown reaction Has patient had a PCN reaction causing immediate rash, facial/tongue/throat swelling, SOB or lightheadedness with hypotension:Unknown Has patient had a PCN reaction causing severe rash involving mucus membranes  or skin necrosis:Unknown Has patient had a PCN reaction that required hospitalization:Unknown Has patient had a PCN reaction occurring within the last 10 years:Unknown If all of the above answers are "NO", then may proceed with Cephalosporin use.     Review of Systems  No complaints She wants to stop smoking BP (!) 122/57 (BP Location: Right Arm, Patient Position: Sitting, Cuff Size: Large)   Pulse 64   Resp 16   Ht 5\' 7"  (1.702 m)   Wt 208 lb (94.3 kg)   SpO2 96% Comment: RA  BMI 32.58 kg/m  Physical Exam      Exam    General- alert and comfortable   Lungs- clear without rales, wheezes   Cor- regular rate and rhythm, no murmur , gallop   Abdomen- soft, non-tender    Extremities - warm, non-tender, minimal edema   Neuro- oriented, appropriate, no focal weakness   Diagnostic Tests:  Chest x-ray clear Impression: Resolved pneumomediastinum  Plan: Return as needed Patient encouraged to stop smoking and phone number to stop smoking clinic provided  Mikey BussingPeter Van Trigt III, MD [facility] [phone]

## 2016-11-12 ENCOUNTER — Observation Stay (HOSPITAL_COMMUNITY)
Admission: EM | Admit: 2016-11-12 | Discharge: 2016-11-13 | Disposition: A | Discharge status: Nursing Home (Includes ICF) | Payer: Medicare HMO | Attending: Internal Medicine | Admitting: Internal Medicine

## 2016-11-12 ENCOUNTER — Emergency Department (HOSPITAL_COMMUNITY): Payer: Medicare HMO

## 2016-11-12 ENCOUNTER — Encounter (HOSPITAL_COMMUNITY): Payer: Self-pay | Admitting: Emergency Medicine

## 2016-11-12 DIAGNOSIS — F17210 Nicotine dependence, cigarettes, uncomplicated: Secondary | ICD-10-CM | POA: Insufficient documentation

## 2016-11-12 DIAGNOSIS — F319 Bipolar disorder, unspecified: Secondary | ICD-10-CM | POA: Diagnosis not present

## 2016-11-12 DIAGNOSIS — R509 Fever, unspecified: Secondary | ICD-10-CM | POA: Diagnosis not present

## 2016-11-12 DIAGNOSIS — Z8614 Personal history of Methicillin resistant Staphylococcus aureus infection: Secondary | ICD-10-CM | POA: Insufficient documentation

## 2016-11-12 DIAGNOSIS — L03119 Cellulitis of unspecified part of limb: Secondary | ICD-10-CM

## 2016-11-12 DIAGNOSIS — I69354 Hemiplegia and hemiparesis following cerebral infarction affecting left non-dominant side: Secondary | ICD-10-CM | POA: Diagnosis not present

## 2016-11-12 DIAGNOSIS — Z7982 Long term (current) use of aspirin: Secondary | ICD-10-CM | POA: Diagnosis not present

## 2016-11-12 DIAGNOSIS — L03115 Cellulitis of right lower limb: Secondary | ICD-10-CM | POA: Diagnosis not present

## 2016-11-12 DIAGNOSIS — I890 Lymphedema, not elsewhere classified: Secondary | ICD-10-CM | POA: Insufficient documentation

## 2016-11-12 DIAGNOSIS — I872 Venous insufficiency (chronic) (peripheral): Secondary | ICD-10-CM | POA: Diagnosis not present

## 2016-11-12 DIAGNOSIS — K219 Gastro-esophageal reflux disease without esophagitis: Secondary | ICD-10-CM | POA: Diagnosis not present

## 2016-11-12 DIAGNOSIS — M79606 Pain in leg, unspecified: Secondary | ICD-10-CM | POA: Diagnosis present

## 2016-11-12 DIAGNOSIS — E785 Hyperlipidemia, unspecified: Secondary | ICD-10-CM | POA: Insufficient documentation

## 2016-11-12 DIAGNOSIS — L03116 Cellulitis of left lower limb: Secondary | ICD-10-CM | POA: Diagnosis not present

## 2016-11-12 DIAGNOSIS — R410 Disorientation, unspecified: Secondary | ICD-10-CM

## 2016-11-12 DIAGNOSIS — Z79899 Other long term (current) drug therapy: Secondary | ICD-10-CM | POA: Insufficient documentation

## 2016-11-12 LAB — URINALYSIS, ROUTINE W REFLEX MICROSCOPIC
BILIRUBIN URINE: NEGATIVE
Glucose, UA: NEGATIVE mg/dL
HGB URINE DIPSTICK: NEGATIVE
KETONES UR: NEGATIVE mg/dL
Leukocytes, UA: NEGATIVE
Nitrite: NEGATIVE
PH: 6 (ref 5.0–8.0)
Protein, ur: NEGATIVE mg/dL
SPECIFIC GRAVITY, URINE: 1.009 (ref 1.005–1.030)

## 2016-11-12 LAB — COMPREHENSIVE METABOLIC PANEL
ALK PHOS: 67 U/L (ref 38–126)
ALT: 15 U/L (ref 14–54)
AST: 21 U/L (ref 15–41)
Albumin: 2.9 g/dL — ABNORMAL LOW (ref 3.5–5.0)
Anion gap: 5 (ref 5–15)
BUN: 14 mg/dL (ref 6–20)
CALCIUM: 8.9 mg/dL (ref 8.9–10.3)
CHLORIDE: 105 mmol/L (ref 101–111)
CO2: 26 mmol/L (ref 22–32)
CREATININE: 0.91 mg/dL (ref 0.44–1.00)
Glucose, Bld: 102 mg/dL — ABNORMAL HIGH (ref 65–99)
Potassium: 3.7 mmol/L (ref 3.5–5.1)
SODIUM: 136 mmol/L (ref 135–145)
Total Bilirubin: 0.5 mg/dL (ref 0.3–1.2)
Total Protein: 6.8 g/dL (ref 6.5–8.1)

## 2016-11-12 LAB — CBC WITH DIFFERENTIAL/PLATELET
BASOS ABS: 0 10*3/uL (ref 0.0–0.1)
Basophils Relative: 0 %
EOS PCT: 0 %
Eosinophils Absolute: 0 10*3/uL (ref 0.0–0.7)
HCT: 29.5 % — ABNORMAL LOW (ref 36.0–46.0)
HEMOGLOBIN: 9.3 g/dL — AB (ref 12.0–15.0)
LYMPHS ABS: 2.2 10*3/uL (ref 0.7–4.0)
Lymphocytes Relative: 20 %
MCH: 25.8 pg — AB (ref 26.0–34.0)
MCHC: 31.5 g/dL (ref 30.0–36.0)
MCV: 81.9 fL (ref 78.0–100.0)
Monocytes Absolute: 1 10*3/uL (ref 0.1–1.0)
Monocytes Relative: 9 %
NEUTROS ABS: 7.7 10*3/uL (ref 1.7–7.7)
NEUTROS PCT: 71 %
PLATELETS: 215 10*3/uL (ref 150–400)
RBC: 3.6 MIL/uL — AB (ref 3.87–5.11)
RDW: 18.1 % — ABNORMAL HIGH (ref 11.5–15.5)
WBC: 10.9 10*3/uL — AB (ref 4.0–10.5)

## 2016-11-12 LAB — I-STAT CG4 LACTIC ACID, ED: LACTIC ACID, VENOUS: 1.09 mmol/L (ref 0.5–1.9)

## 2016-11-12 MED ORDER — THIOTHIXENE 10 MG PO CAPS
10.0000 mg | ORAL_CAPSULE | Freq: Two times a day (BID) | ORAL | Status: DC
  Administered 2016-11-13: 09:00:00 10 mg via ORAL
  Filled 2016-11-12 (×2): qty 1

## 2016-11-12 MED ORDER — ENOXAPARIN SODIUM 40 MG/0.4ML ~~LOC~~ SOLN
40.0000 mg | SUBCUTANEOUS | Status: DC
  Administered 2016-11-13: 09:00:00 40 mg via SUBCUTANEOUS
  Filled 2016-11-12: qty 0.4

## 2016-11-12 MED ORDER — DIVALPROEX SODIUM 500 MG PO DR TAB
1000.0000 mg | DELAYED_RELEASE_TABLET | Freq: Every day | ORAL | Status: DC
  Filled 2016-11-12: qty 2

## 2016-11-12 MED ORDER — SODIUM CHLORIDE 0.9 % IV BOLUS (SEPSIS)
1000.0000 mL | Freq: Once | INTRAVENOUS | Status: AC
  Administered 2016-11-12: 1000 mL via INTRAVENOUS

## 2016-11-12 MED ORDER — ASPIRIN EC 81 MG PO TBEC
81.0000 mg | DELAYED_RELEASE_TABLET | Freq: Every day | ORAL | Status: DC
  Administered 2016-11-13: 09:00:00 81 mg via ORAL
  Filled 2016-11-12: qty 1

## 2016-11-12 MED ORDER — VANCOMYCIN HCL IN DEXTROSE 1-5 GM/200ML-% IV SOLN
1000.0000 mg | Freq: Once | INTRAVENOUS | Status: AC
  Administered 2016-11-12: 1000 mg via INTRAVENOUS
  Filled 2016-11-12: qty 200

## 2016-11-12 MED ORDER — DARIFENACIN HYDROBROMIDE ER 15 MG PO TB24
15.0000 mg | ORAL_TABLET | Freq: Every day | ORAL | Status: DC
  Administered 2016-11-13: 15 mg via ORAL
  Filled 2016-11-12: qty 1

## 2016-11-12 MED ORDER — ADULT MULTIVITAMIN W/MINERALS CH
1.0000 | ORAL_TABLET | Freq: Every day | ORAL | Status: DC
  Administered 2016-11-13: 1 via ORAL
  Filled 2016-11-12: qty 1

## 2016-11-12 MED ORDER — OLANZAPINE 10 MG PO TABS
30.0000 mg | ORAL_TABLET | Freq: Every day | ORAL | Status: DC
  Filled 2016-11-12: qty 3

## 2016-11-12 MED ORDER — ATORVASTATIN CALCIUM 40 MG PO TABS
40.0000 mg | ORAL_TABLET | Freq: Every evening | ORAL | Status: DC
  Filled 2016-11-12: qty 1

## 2016-11-12 MED ORDER — DICLOFENAC SODIUM 1 % TD GEL
4.0000 g | Freq: Four times a day (QID) | TRANSDERMAL | Status: DC
  Administered 2016-11-13 (×2): 4 g via TOPICAL
  Filled 2016-11-12: qty 100

## 2016-11-12 MED ORDER — ACETAMINOPHEN 500 MG PO TABS
500.0000 mg | ORAL_TABLET | Freq: Four times a day (QID) | ORAL | Status: DC | PRN
  Administered 2016-11-13: 500 mg via ORAL
  Filled 2016-11-12: qty 1

## 2016-11-12 MED ORDER — PANTOPRAZOLE SODIUM 40 MG PO TBEC
40.0000 mg | DELAYED_RELEASE_TABLET | Freq: Every day | ORAL | Status: DC
  Administered 2016-11-13: 40 mg via ORAL
  Filled 2016-11-12: qty 1

## 2016-11-12 MED ORDER — LORAZEPAM 1 MG PO TABS
1.0000 mg | ORAL_TABLET | ORAL | Status: DC | PRN
  Filled 2016-11-12: qty 1

## 2016-11-12 MED ORDER — LORAZEPAM 0.5 MG PO TABS
0.5000 mg | ORAL_TABLET | Freq: Every day | ORAL | Status: DC
  Administered 2016-11-13: 09:00:00 0.5 mg via ORAL
  Filled 2016-11-12: qty 1

## 2016-11-12 MED ORDER — SODIUM CHLORIDE 0.9 % IV SOLN
1250.0000 mg | Freq: Two times a day (BID) | INTRAVENOUS | Status: DC
  Administered 2016-11-13: 04:00:00 1250 mg via INTRAVENOUS
  Filled 2016-11-12 (×2): qty 1250

## 2016-11-12 MED ORDER — HYDROCODONE-ACETAMINOPHEN 5-325 MG PO TABS: 1.0000 | ORAL_TABLET | Freq: Four times a day (QID) | ORAL | Status: DC | PRN

## 2016-11-12 NOTE — ED Notes (Signed)
Patient transported to X-ray 

## 2016-11-12 NOTE — H&P (Signed)
History and Physical    Betty HerbertShirley Keelan KGM:[id number] DOB: 12/31/1950 DOA: 11/12/2016  PCP: PROVIDER NOT IN SYSTEM  Patient [facility]  I have personally briefly reviewed patient's old medical [facility]  Chief Complaint: Leg pain  HPI: Betty Buchanan is a 66 y.o. female with medical history significant of L sided hemiplegia from prior stroke.  Patient presents to the ED with 1 day history of 10/10 BLE pain in ankles, erythema, warmth.  L>R.  Symptoms onset yesterday, persistent since onset, nothing tried for symptoms thus far, nothing makes symptoms better or worse.   ED Course: Apparently initially confused, mental status has cleared by the time I am seeing her, although based on home baseline meds (depakote, ativan, zyprexa, thiothixene), I am questioning if she has sundowning at times at baseline.  Tm 100.2 in ED, WBC 10.9k.  Started on vanc.   Review of Systems: As per HPI otherwise 10 point review of systems negative.   Past Medical History:  Diagnosis Date  . Bipolar 1 disorder (HCC)   . GERD (gastroesophageal reflux disease)   . Hemiplegia (HCC)    L side  . Osteoarthritis   . Stroke [facility](HCC)     Past Surgical History:  Procedure Laterality Date  . KNEE ARTHROSCOPY       reports that she has been smoking Cigarettes.  She has never used smokeless tobacco. She reports that she does not drink alcohol or use drugs.  Allergies  Allergen Reactions  . Penicillins     Unknown reaction Has patient had a PCN reaction causing immediate rash, facial/tongue/throat swelling, SOB or lightheadedness with hypotension:Unknown Has patient had a PCN reaction causing severe rash involving mucus membranes or skin necrosis:Unknown Has patient had a PCN reaction that required hospitalization:Unknown Has patient had a PCN reaction occurring within the last 10 years:Unknown If all of the above answers are "NO", then may proceed with Cephalosporin use.      Family History  Problem Relation Age of Onset  . Cancer Father   . Stroke Father      Prior to Admission medications   Medication Sig Start Date End Date Taking? Authorizing Provider  acetaminophen (TYLENOL) 500 MG tablet Take 500 mg by mouth every 6 (six) hours as needed for mild pain, moderate pain or fever. Do not exceed 3gm/24h   Yes Historical Provider, MD  aspirin EC 81 MG tablet Take 81 mg by mouth daily with breakfast.   Yes Historical Provider, MD  atorvastatin (LIPITOR) 40 MG tablet Take 40 mg by mouth every evening.   Yes Historical Provider, MD  diclofenac sodium (VOLTAREN) 1 % GEL Apply 4 g topically 4 (four) times daily. 07/21/16  Yes Sharlene Doryessa N Conte, PA-C  divalproex (DEPAKOTE) 500 MG DR tablet Take 500-1,000 mg by mouth 2 (two) times daily. 500 MG in the morning and 1000 MG in the evening   Yes Historical Provider, MD  LORazepam (ATIVAN) 0.5 MG tablet Take 0.5 mg by mouth daily.   Yes Historical Provider, MD  LORazepam (ATIVAN) 1 MG tablet Take 1 mg by mouth every 4 (four) hours as needed for anxiety (and agitation).   Yes Historical Provider, MD  Multiple Vitamin (MULTIVITAMIN WITH MINERALS) TABS tablet Take 1 tablet by mouth daily.   Yes Historical Provider, MD  OLANZapine (ZYPREXA) 15 MG tablet Take 30 mg by mouth at bedtime.    Yes Historical Provider, MD  pantoprazole (PROTONIX) 40 MG tablet Take 40 mg by mouth daily  with breakfast.   Yes Historical Provider, MD  solifenacin (VESICARE) 10 MG tablet Take 10 mg by mouth daily.   Yes Historical Provider, MD  thiothixene (NAVANE) 10 MG capsule Take 10 mg by mouth 2 (two) times daily.   Yes Historical Provider, MD    Physical Exam: Vitals:   11/12/16 2035 11/12/16 2100 11/12/16 2130 11/12/16 2200  BP:  (!) 121/57 116/65 (!) 103/51  Pulse:  74 70 87  Resp:  16 16 14   Temp: 100 F (37.8 C)     TempSrc: Rectal     SpO2:  100% 97% 94%  Weight:      Height:        Constitutional: NAD, calm, comfortable Eyes:  PERRL, lids and conjunctivae normal ENMT: Mucous membranes are moist. Posterior pharynx clear of any exudate or lesions.Normal dentition.  Neck: normal, supple, no masses, no thyromegaly Respiratory: clear to auscultation bilaterally, no wheezing, no crackles. Normal respiratory effort. No accessory muscle use.  Cardiovascular: Regular rate and rhythm, no murmurs / rubs / gallops. No extremity edema. 2+ pedal pulses. No carotid bruits.  Abdomen: no tenderness, no masses palpated. No hepatosplenomegaly. Bowel sounds positive.  Musculoskeletal: no clubbing / cyanosis. No joint deformity upper and lower extremities. Good ROM, no contractures. Normal muscle tone.  Skin: no rashes, lesions, ulcers. No induration Neurologic: L sided hemiplegia Psychiatric: Normal judgment and insight. Alert and oriented x 3. Normal mood.    Labs on Admission: I have personally reviewed following labs and imaging studies  CBC:  Recent Labs Lab 11/12/16 2044  WBC 10.9*  NEUTROABS 7.7  HGB 9.3*  HCT 29.5*  MCV 81.9  PLT 215   Basic Metabolic Panel:  Recent Labs Lab 11/12/16 2044  NA 136  K 3.7  CL 105  CO2 26  GLUCOSE 102*  BUN 14  CREATININE 0.91  CALCIUM 8.9   GFR: Estimated Creatinine Clearance: 72.7 mL/min (by C-G formula based on SCr of 0.91 mg/dL). Liver Function Tests:  Recent Labs Lab 11/12/16 2044  AST 21  ALT 15  ALKPHOS 67  BILITOT 0.5  PROT 6.8  ALBUMIN 2.9*   No results for input(s): LIPASE, AMYLASE in the last 168 hours. No results for input(s): AMMONIA in the last 168 hours. Coagulation Profile: No results for input(s): INR, PROTIME in the last 168 hours. Cardiac Enzymes: No results for input(s): CKTOTAL, CKMB, CKMBINDEX, TROPONINI in the last 168 hours. BNP (last 3 results) No results for input(s): PROBNP in the last 8760 hours. HbA1C: No results for input(s): HGBA1C in the last 72 hours. CBG: No results for input(s): GLUCAP in the last 168 hours. Lipid  Profile: No results for input(s): CHOL, HDL, LDLCALC, TRIG, CHOLHDL, LDLDIRECT in the last 72 hours. Thyroid Function Tests: No results for input(s): TSH, T4TOTAL, FREET4, T3FREE, THYROIDAB in the last 72 hours. Anemia Panel: No results for input(s): VITAMINB12, FOLATE, FERRITIN, TIBC, IRON, RETICCTPCT in the last 72 hours. Urine analysis:    Component Value Date/Time   COLORURINE YELLOW 07/15/2016 1921   APPEARANCEUR CLOUDY (A) 07/15/2016 1921   LABSPEC 1.015 07/15/2016 1921   PHURINE 8.0 07/15/2016 1921   GLUCOSEU NEGATIVE 07/15/2016 1921   HGBUR LARGE (A) 07/15/2016 1921   BILIRUBINUR NEGATIVE 07/15/2016 1921   KETONESUR NEGATIVE 07/15/2016 1921   PROTEINUR NEGATIVE 07/15/2016 1921   UROBILINOGEN 1.0 07/08/2015 2354   NITRITE NEGATIVE 07/15/2016 1921   LEUKOCYTESUR NEGATIVE 07/15/2016 1921    Radiological Exams on Admission: Dg Chest 2 View  Result Date:  11/12/2016 CLINICAL DATA:  Fever EXAM: CHEST  2 VIEW COMPARISON:  August 12, 2016 FINDINGS: There is no edema or consolidation. Heart size and pulmonary vascularity are normal. No adenopathy. There is degenerative change in the thoracic spine. IMPRESSION: No edema or consolidation. Electronically Signed   By: Bretta Bang III M.D.   On: 11/12/2016 21:31    EKG: Independently reviewed.  Assessment/Plan Principal Problem:   Left leg cellulitis Active Problems:   Cellulitis of right leg   Chronic venous stasis dermatitis of both lower extremities    1. Cellulitis of BLE - L>R 1. Vancomycin empirically given history of MRSA colonization in Nov, nursing home residence. 2. Since appears to be bilateral will get BCx 3. Repeat CBC/ BMP in AM 2. h/o BPD - 1. continue home psych meds 2. Mental status is clear in ED, but based on the baseline psych meds I wouldn't be surprised to find out she carries more diagnoses that just BPD 1 disorder, or see sundowning while here. 3. h/o Stroke with Hemiplegia - continue ASA,  statin  DVT prophylaxis: Lovenox Code Status: Full Family Communication: No family in room Disposition Plan: Back to [facility] called: None Admission status: Place in [place], [facility]. DO [facility] Pager [phone]  If 7AM-7PM, please contact day team taking care of patient [email or web address] Password [facility]  11/12/2016, 11:37 PM

## 2016-11-12 NOTE — ED Triage Notes (Signed)
Pt comes to ed. From [facility], possible DVT/ leg pain, warm to touch, red, edema. Pt complains of pain 10 out 10. Pt has hx of venous status, GERD, left hemiplegia. Weakness on left side. allergies penicillin. V/s 118/62, hr 82 reg, rr22, sp02 96, temp 100.2 oral cbg 117.

## 2016-11-12 NOTE — Progress Notes (Signed)
Pharmacy Antibiotic Follow-up Note  Betty Buchanan is a 66 y.o. year-old female admitted on 11/12/2016.  The patient is currently on day 1 of Vancomycin for r/o sepsis. Noted hx of PCN allergy - reaction unknown. CXray neg for acute process.  Assessment/Plan: Vancomycin 1gm x1 then 1250mg   IV every 12 hours.  Goal trough 15-20 mcg/mL.   Temp (24hrs), Avg:99.6 F (37.6 C), Min:99.1 F (37.3 C), Max:100 F (37.8 C)   Recent Labs Lab 11/12/16 2044  WBC 10.9*    Recent Labs Lab 11/12/16 2044  CREATININE 0.91   Estimated Creatinine Clearance: 72.7 mL/min (by C-G formula based on SCr of 0.91 mg/dL).    Allergies  Allergen Reactions  . Penicillins     Unknown reaction Has patient had a PCN reaction causing immediate rash, facial/tongue/throat swelling, SOB or lightheadedness with hypotension:Unknown Has patient had a PCN reaction causing severe rash involving mucus membranes or skin necrosis:Unknown Has patient had a PCN reaction that required hospitalization:Unknown Has patient had a PCN reaction occurring within the last 10 years:Unknown If all of the above answers are "NO", then may proceed with Cephalosporin use.    Antimicrobials this admission: 3/22 Vancomycin >>   Levels/dose changes this admission:  Microbiology results: 3/22 BCx: sent         UCx: ordered   Thank you for allowing pharmacy to be a part of this patient's care.  Otho BellowsGreen, Mechelle Pates L PharmD 11/12/2016 10:15 PM

## 2016-11-12 NOTE — ED Provider Notes (Signed)
[facility]-EMERGENCY DEPT Provider Note   CSN: [id number] Arrival date & time: 11/12/16  1927     History   Chief Complaint Chief Complaint  Patient presents with  . Claudication    leg pain rule out clots     HPI Bard HerbertShirley Melle is a 66 y.o. female. Cc: confusion, leg redness, fever.  HPI:  [age] year old female who resides at [facility] because of a history of schizophrenia and previous CVA with left hemiparesis. Sent in tonight from her facility complaining of leg pain. No history of arterial or venous clot. Complains of leg pain had temp of 112 at her facility. Given Tylenol prior to transfer here. Arrives with some confusion. Does become more clear during her department stay and recheck temperature is improved.  Past Medical History:  Diagnosis Date  . Bipolar 1 disorder (HCC)   . GERD (gastroesophageal reflux disease)   . Hemiplegia (HCC)    L side  . Osteoarthritis   . Stroke [facility](HCC)     Patient Active Problem List   Diagnosis Date Noted  . Pneumomediastinum (HCC) 07/16/2016    Past Surgical History:  Procedure Laterality Date  . KNEE ARTHROSCOPY      OB History    No data available       Home Medications    Prior to Admission medications   Medication Sig Start Date End Date Taking? Authorizing Provider  acetaminophen (TYLENOL) 500 MG tablet Take 500 mg by mouth every 6 (six) hours as needed for mild pain, moderate pain or fever. Do not exceed 3gm/24h   Yes Historical Provider, MD  aspirin EC 81 MG tablet Take 81 mg by mouth daily with breakfast.   Yes Historical Provider, MD  atorvastatin (LIPITOR) 40 MG tablet Take 40 mg by mouth every evening.   Yes Historical Provider, MD  diclofenac sodium (VOLTAREN) 1 % GEL Apply 4 g topically 4 (four) times daily. 07/21/16  Yes Sharlene Doryessa N Conte, PA-C  divalproex (DEPAKOTE) 500 MG DR tablet Take 500-1,000 mg by mouth 2 (two) times daily. 500 MG in the morning and 1000 MG in the evening   Yes Historical Provider, MD  LORazepam  (ATIVAN) 0.5 MG tablet Take 0.5 mg by mouth daily.   Yes Historical Provider, MD  LORazepam (ATIVAN) 1 MG tablet Take 1 mg by mouth every 4 (four) hours as needed for anxiety (and agitation).   Yes Historical Provider, MD  Multiple Vitamin (MULTIVITAMIN WITH MINERALS) TABS tablet Take 1 tablet by mouth daily.   Yes Historical Provider, MD  OLANZapine (ZYPREXA) 15 MG tablet Take 30 mg by mouth at bedtime.    Yes Historical Provider, MD  pantoprazole (PROTONIX) 40 MG tablet Take 40 mg by mouth daily with breakfast.   Yes Historical Provider, MD  solifenacin (VESICARE) 10 MG tablet Take 10 mg by mouth daily.   Yes Historical Provider, MD  thiothixene (NAVANE) 10 MG capsule Take 10 mg by mouth 2 (two) times daily.   Yes Historical Provider, MD    Family History Family History  Problem Relation Age of Onset  . Cancer Father   . Stroke Father     Social History Social History  Substance Use Topics  . Smoking status: Current Every Day Smoker    Types: Cigarettes  . Smokeless tobacco: Never Used  . Alcohol use No     Allergies   Penicillins   Review of Systems Review of Systems  Unable to perform ROS: Mental status change  Constitutional: Positive for fatigue  and fever.  Musculoskeletal:       Leg pain, swelling, and redness  Skin: Positive for color change.     Physical Exam Updated Vital Signs BP (!) 103/51   Pulse 87   Temp 100 F (37.8 C) (Rectal)   Resp 14   Ht 5\' 6"  (1.676 m)   Wt 210 lb (95.3 kg)   SpO2 94%   BMI 33.89 kg/m   Physical Exam  Constitutional:  Elderly female. Eyes closed. Difficult to understand speech. She does become more awake and is able to tell me that she is at [facility] and that her legs hurt. She states her temperature was 101.2.  HENT:  Head: Normocephalic.  Eyes: Conjunctivae are normal. Pupils are equal, round, and reactive to light. No scleral icterus.  Neck: Normal range of motion. Neck supple. No thyromegaly present.    Cardiovascular: Normal rate and regular rhythm.  Exam reveals no gallop and no friction rub.   No murmur heard. Pulmonary/Chest: Effort normal and breath sounds normal. No respiratory distress. She has no wheezes. She has no rales.  Abdominal: Soft. Bowel sounds are normal. She exhibits no distension. There is no tenderness. There is no rebound.  Musculoskeletal: Normal range of motion.  Skin: No rash noted.  Bilateral extremity is with 1+ edema symmetric lower extremities show erythema and warmth. No asymmetry in size. DP and PT pulses are palpable.  Psychiatric: She has a normal mood and affect. Her behavior is normal.     ED Treatments / Results  Labs (all labs ordered are listed, but only abnormal results are displayed) Labs Reviewed  CBC WITH DIFFERENTIAL/PLATELET - Abnormal; Notable for the following:       Result Value   WBC 10.9 (*)    RBC 3.60 (*)    Hemoglobin 9.3 (*)    HCT 29.5 (*)    MCH 25.8 (*)    RDW 18.1 (*)    All other components within normal limits  COMPREHENSIVE METABOLIC PANEL - Abnormal; Notable for the following:    Glucose, Bld 102 (*)    Albumin 2.9 (*)    All other components within normal limits  CULTURE, BLOOD (ROUTINE X 2)  CULTURE, BLOOD (ROUTINE X 2)  URINE CULTURE  URINALYSIS, ROUTINE W REFLEX MICROSCOPIC  I-STAT CG4 LACTIC ACID, ED    EKG  EKG Interpretation None       Radiology Dg Chest 2 View  Result Date: 11/12/2016 CLINICAL DATA:  Fever EXAM: CHEST  2 VIEW COMPARISON:  August 12, 2016 FINDINGS: There is no edema or consolidation. Heart size and pulmonary vascularity are normal. No adenopathy. There is degenerative change in the thoracic spine. IMPRESSION: No edema or consolidation. Electronically Signed   By: Bretta Bang III M.D.   On: 11/12/2016 21:31    Procedures Procedures (including critical care time)  Medications Ordered in ED Medications  vancomycin (VANCOCIN) IVPB 1000 mg/200 mL premix (1,000 mg Intravenous  New Bag/Given 11/12/16 2243)  vancomycin (VANCOCIN) 1,250 mg in sodium chloride 0.9 % 250 mL IVPB (not administered)  sodium chloride 0.9 % bolus 1,000 mL (0 mLs Intravenous Stopped 11/12/16 2205)     Initial Impression / Assessment and Plan / ED Course  I have reviewed the triage vital signs and the nursing notes.  Pertinent labs & imaging results that were available during my care of the patient were reviewed by me and considered in my medical decision making (see chart for details).    Vascular  exam does not show any concerns. She has good capillary refill and pulses. No asymmetry. Fever would suggest infection. Saline as would likely be her source. Her mental status has improved. Normal lactate. No leukocytosis. Given IV vancomycin after cultures obtained. Cath urine being performed currently. Will discuss with hospitalist Re: Admission  Final Clinical Impressions(s) / ED Diagnoses   Final diagnoses:  Fever, unspecified fever cause  Confusion  Cellulitis of lower extremity, unspecified laterality    New Prescriptions New Prescriptions   No medications on file     Rolland Porter, MD 11/12/16 2320

## 2016-11-13 DIAGNOSIS — L03119 Cellulitis of unspecified part of limb: Secondary | ICD-10-CM | POA: Diagnosis not present

## 2016-11-13 DIAGNOSIS — L03116 Cellulitis of left lower limb: Secondary | ICD-10-CM | POA: Diagnosis not present

## 2016-11-13 DIAGNOSIS — I639 Cerebral infarction, unspecified: Secondary | ICD-10-CM

## 2016-11-13 DIAGNOSIS — L02419 Cutaneous abscess of limb, unspecified: Secondary | ICD-10-CM

## 2016-11-13 DIAGNOSIS — F319 Bipolar disorder, unspecified: Secondary | ICD-10-CM | POA: Diagnosis not present

## 2016-11-13 DIAGNOSIS — K219 Gastro-esophageal reflux disease without esophagitis: Secondary | ICD-10-CM

## 2016-11-13 DIAGNOSIS — I8310 Varicose veins of unspecified lower extremity with inflammation: Secondary | ICD-10-CM

## 2016-11-13 DIAGNOSIS — I872 Venous insufficiency (chronic) (peripheral): Secondary | ICD-10-CM | POA: Diagnosis not present

## 2016-11-13 LAB — CBC
HCT: 29.3 % — ABNORMAL LOW (ref 36.0–46.0)
Hemoglobin: 9.5 g/dL — ABNORMAL LOW (ref 12.0–15.0)
MCH: 27.1 pg (ref 26.0–34.0)
MCHC: 32.4 g/dL (ref 30.0–36.0)
MCV: 83.7 fL (ref 78.0–100.0)
PLATELETS: 192 10*3/uL (ref 150–400)
RBC: 3.5 MIL/uL — AB (ref 3.87–5.11)
RDW: 18.4 % — AB (ref 11.5–15.5)
WBC: 7.9 10*3/uL (ref 4.0–10.5)

## 2016-11-13 LAB — BASIC METABOLIC PANEL
Anion gap: 5 (ref 5–15)
BUN: 11 mg/dL (ref 6–20)
CALCIUM: 8.9 mg/dL (ref 8.9–10.3)
CO2: 25 mmol/L (ref 22–32)
CREATININE: 0.82 mg/dL (ref 0.44–1.00)
Chloride: 109 mmol/L (ref 101–111)
GFR calc Af Amer: >60 mL/min (ref >60)
GLUCOSE: 120 mg/dL — AB (ref 65–99)
POTASSIUM: 3.4 mmol/L — AB (ref 3.5–5.1)
SODIUM: 139 mmol/L (ref 135–145)

## 2016-11-13 LAB — SURGICAL PCR SCREEN
MRSA, PCR: NEGATIVE
Staphylococcus aureus: NEGATIVE

## 2016-11-13 MED ORDER — DIVALPROEX SODIUM 250 MG PO DR TAB
500.0000 mg | DELAYED_RELEASE_TABLET | Freq: Every day | ORAL | Status: DC
  Administered 2016-11-13: 09:00:00 500 mg via ORAL
  Filled 2016-11-13: qty 2

## 2016-11-13 MED ORDER — DOXYCYCLINE HYCLATE 100 MG PO TABS: 100.0000 mg | ORAL_TABLET | Freq: Two times a day (BID) | ORAL | 0 refills | Status: AC

## 2016-11-13 NOTE — Care Management Obs Status (Signed)
MEDICARE OBSERVATION STATUS NOTIFICATION   Patient Details  Name: Betty Buchanan MRN: [id number] Date of Birth: December 16, 1950   Medicare Observation Status Notification Given:  Yes    MahabirOlegario Messier, Alandra Sando, RN 11/13/2016, 2:11 PM

## 2016-11-13 NOTE — Care Management Note (Signed)
Case Management Note  Patient Details  Name: Betty HerbertShirley Buchanan MRN: [id number] Date of Birth: January 30, 1951  Subjective/Objective: 66 y/o f admitted w/l leg cellulitis. From ALF-[facility] spoke to Betty Buchanan, & Betty Buchanan( DON) they have their own HHPT, they will monitor l leg wound. No further CM needs.CSW managing return back to ALF.                   Action/Plan:d/c ALF   Expected Discharge Date:  11/13/16               Expected Discharge Plan:  Home w Home Health Services  In-House Referral:  Clinical Social Work  Discharge planning Services  CM Consult  Post Acute Care Choice:    Choice offered to:     DME Arranged:    DME Agency:     HH Arranged:  PT, RN HH Agency:   (facility has own HHPT-[facility]; they will have their nurse to monitor l leg wound.)  Status of Service:  Completed, signed off  If discussed at Long Length of Stay Meetings, dates discussed:    Additional Comments:  Betty ClamMahabir, Betty Batterton, RN 11/13/2016, 2:12 PM

## 2016-11-13 NOTE — Discharge Summary (Signed)
Physician Discharge Summary  Kenny Stern RUE:[id number] DOB: 17-Jan-1951 DOA: 11/12/2016  PCP: PROVIDER NOT IN SYSTEM  Admit date: 11/12/2016 Discharge date: 11/13/2016  Time spent: 35 minutes  Recommendations for Outpatient Follow-up:  Repeat BMEt to follow electrolytes and renal function  Reassess resolution of Le cellulitis   Discharge Diagnoses:  Principal Problem:   Left leg cellulitis Active Problems:   Cellulitis of right leg   Chronic venous stasis dermatitis of both lower extremities   Bipolar affective disorder (HCC)   Gastroesophageal reflux disease prior hx of Stroke with residual hemiplegia   Discharge Condition: stable and improved. Will discharge home with [facility] services and instructions to follow up with PCP in 10 days.  Diet recommendation: heart healthy diet (low sodium)  Filed Weights   11/12/16 1951  Weight: 95.3 kg (210 lb)    History of present illness:  66 y.o. female with medical history significant of L sided hemiplegia from prior stroke.  Patient presents to the ED with 1 day history of 10/10 BLE pain in ankles, erythema, warmth.  L>R.  Symptoms onset yesterday, persistent since onset, nothing tried for symptoms thus far, nothing makes symptoms better or worse.  Hospital Course:  1-LE cellulitis: patient with bilaterally LE lymphedema/venous stasis and superimposed cellulitis (left). -observe overnight due to very low grade fever -now stable, afebrile, no nausea/vomiting or any acute distress -will discharge on doxycycline with intention to treat for 10 days -will arrange for home health RN and HHPT -advise to follow low sodium diet and to keep legs elevated  2-HLD -will continue statins  3-GERD -will continue PPI  4-bipolar disorder -no SI or hallucinations currently -will resume home psychiatric regimen; will benefit of outpatient psych follow up  5-prior hx of stroke -with residual hemiplegia; at baseline and unchanged -will continue ASA  for secondary prevention  -continue statins  -PT to follow patient at discharge  Procedures:  See below for x-ray reports   Consultations:  None   Discharge Exam: Vitals:   11/13/16 0507 11/13/16 1347  BP: (!) 126/49 (!) 127/58  Pulse: (!) 51 64  Resp: 14 14  Temp: 98.5 F (36.9 C) 97.9 F (36.6 C)    General: afebrile, talkative and slightly confused regarding situation, otherwise oriented and able to follow commands. No nausea, no vomiting and reports not acute distress. Cardiovascular: S1 and S2, no rubs, no gallops Respiratory: CTA bilaterally Abd: soft, NT, ND, positive BS Extremities: LE swelling 1++ bilaterally, erythema appreciated, no open wounds, patient reports improvement in pain.  Discharge Instructions   Discharge Instructions    Diet - low sodium heart healthy    Complete by:  As directed    Discharge instructions    Complete by:  As directed    Keep legs elevated Follow low sodium diet Take medications as prescribed  Maintain adequate hydration     Current Discharge Medication List    START taking these medications   Details  doxycycline (VIBRA-TABS) 100 MG tablet Take 1 tablet (100 mg total) by mouth 2 (two) times daily. Qty: 20 tablet, Refills: 0      CONTINUE these medications which have NOT CHANGED   Details  acetaminophen (TYLENOL) 500 MG tablet Take 500 mg by mouth every 6 (six) hours as needed for mild pain, moderate pain or fever. Do not exceed 3gm/24h    aspirin EC 81 MG tablet Take 81 mg by mouth daily with breakfast.    atorvastatin (LIPITOR) 40 MG tablet Take 40 mg by mouth  every evening.    diclofenac sodium (VOLTAREN) 1 % GEL Apply 4 g topically 4 (four) times daily. Qty: 100 g, Refills: 0    divalproex (DEPAKOTE) 500 MG DR tablet Take 500-1,000 mg by mouth 2 (two) times daily. 500 MG in the morning and 1000 MG in the evening    !! LORazepam (ATIVAN) 0.5 MG tablet Take 0.5 mg by mouth daily.    !! LORazepam (ATIVAN) 1 MG  tablet Take 1 mg by mouth every 4 (four) hours as needed for anxiety (and agitation).    Multiple Vitamin (MULTIVITAMIN WITH MINERALS) TABS tablet Take 1 tablet by mouth daily.    OLANZapine (ZYPREXA) 15 MG tablet Take 30 mg by mouth at bedtime.     pantoprazole (PROTONIX) 40 MG tablet Take 40 mg by mouth daily with breakfast.    solifenacin (VESICARE) 10 MG tablet Take 10 mg by mouth daily.    thiothixene (NAVANE) 10 MG capsule Take 10 mg by mouth 2 (two) times daily.     !! - Potential duplicate medications found. Please discuss with provider.     Allergies  Allergen Reactions  . Penicillins     Unknown reaction Has patient had a PCN reaction causing immediate rash, facial/tongue/throat swelling, SOB or lightheadedness with hypotension:Unknown Has patient had a PCN reaction causing severe rash involving mucus membranes or skin necrosis:Unknown Has patient had a PCN reaction that required hospitalization:Unknown Has patient had a PCN reaction occurring within the last 10 years:Unknown If all of the above answers are "NO", then may proceed with Cephalosporin use.       The results of significant diagnostics from this hospitalization (including imaging, microbiology, ancillary and laboratory) are listed below for reference.    Significant Diagnostic Studies: Dg Chest 2 View  Result Date: 11/12/2016 CLINICAL DATA:  Fever EXAM: CHEST  2 VIEW COMPARISON:  August 12, 2016 FINDINGS: There is no edema or consolidation. Heart size and pulmonary vascularity are normal. No adenopathy. There is degenerative change in the thoracic spine. IMPRESSION: No edema or consolidation. Electronically Signed   By: Bretta BangWilliam  Woodruff III M.D.   On: 11/12/2016 21:31   Microbiology: Recent Results (from the past 240 hour(s))  Surgical pcr screen     Status: None   Collection Time: 11/13/16  1:05 AM  Result Value Ref Range Status   MRSA, PCR NEGATIVE NEGATIVE Final   Staphylococcus aureus NEGATIVE  NEGATIVE Final    Comment:        The Xpert SA Assay (FDA approved for NASAL specimens in patients over 65 years of age), is one component of a comprehensive surveillance program.  Test performance has been validated by [facility] for patients greater than or equal to 65 year old. It is not intended to diagnose infection nor to guide or monitor treatment.      Labs: Basic Metabolic Panel:  Recent Labs Lab 11/12/16 2044 11/13/16 0911  NA 136 139  K 3.7 3.4*  CL 105 109  CO2 26 25  GLUCOSE 102* 120*  BUN 14 11  CREATININE 0.91 0.82  CALCIUM 8.9 8.9   Liver Function Tests:  Recent Labs Lab 11/12/16 2044  AST 21  ALT 15  ALKPHOS 67  BILITOT 0.5  PROT 6.8  ALBUMIN 2.9*   CBC:  Recent Labs Lab 11/12/16 2044 11/13/16 0911  WBC 10.9* 7.9  NEUTROABS 7.7  --   HGB 9.3* 9.5*  HCT 29.5* 29.3*  MCV 81.9 83.7  PLT 215 192  Signed:  Vassie Loll MD.  [facility] 11/13/2016, 2:00 PM

## 2016-11-13 NOTE — NC FL2 (Signed)
[place] MEDICAID FL2 LEVEL OF CARE SCREENING TOOL     IDENTIFICATION  Patient Name: Betty Buchanan Birthdate: [birth year] Sex: female Admission Date (Current Location): 11/12/2016  [place] [facility] [place]   Facility and Address:  [facility],  [address], [place] [id number]      Provider Number: [id number]  Attending Physician Name and Address:  Vassie Lollarlos Madera, MD  Relative Name and Phone Number:       Current Level of Care: Hospital Recommended Level of Care: Assisted Living Facility Prior Approval Number:    Date Approved/Denied:   PASRR Number:    Discharge Plan: Other (Comment) (ALF)    Current Diagnoses: Patient Active Problem List   Diagnosis Date Noted  . Bipolar affective disorder (HCC)   . Gastroesophageal reflux disease   . Cellulitis of right leg 11/12/2016  . Chronic venous stasis dermatitis of both lower extremities 11/12/2016  . Left leg cellulitis 11/12/2016  . Pneumomediastinum (HCC) 07/16/2016    Orientation RESPIRATION BLADDER Height & Weight     Self, Place  Normal Continent Weight: 210 lb (95.3 kg) Height:  5\' 6"  (167.6 cm)  BEHAVIORAL SYMPTOMS/MOOD NEUROLOGICAL BOWEL NUTRITION STATUS      Continent  (low sodium diet)  AMBULATORY STATUS COMMUNICATION OF NEEDS Skin   Limited Assist Verbally Normal                       Personal Care Assistance Level of Assistance  Bathing, Dressing Bathing Assistance: Limited assistance   Dressing Assistance: Limited assistance     Functional Limitations Info             SPECIAL CARE FACTORS FREQUENCY  PT (By licensed PT), OT (By licensed OT)     PT Frequency: 3/wk with home health OT Frequency: 3/wk with home health            Contractures      Additional Factors Info  Code Status, Allergies, Isolation Precautions Code Status Info: FULL Allergies Info: penicillins     Isolation Precautions Info: MRSA     Current Discharge Medication List        START taking these medications   Details  doxycycline (VIBRA-TABS) 100 MG tablet Take 1 tablet (100 mg total) by mouth 2 (two) times daily. Qty: 20 tablet, Refills: 0         CONTINUE these medications which have NOT CHANGED   Details  acetaminophen (TYLENOL) 500 MG tablet Take 500 mg by mouth every 6 (six) hours as needed for mild pain, moderate pain or fever. Do not exceed 3gm/24h    aspirin EC 81 MG tablet Take 81 mg by mouth daily with breakfast.    atorvastatin (LIPITOR) 40 MG tablet Take 40 mg by mouth every evening.    diclofenac sodium (VOLTAREN) 1 % GEL Apply 4 g topically 4 (four) times daily. Qty: 100 g, Refills: 0    divalproex (DEPAKOTE) 500 MG DR tablet Take 500-1,000 mg by mouth 2 (two) times daily. 500 MG in the morning and 1000 MG in the evening    !! LORazepam (ATIVAN) 0.5 MG tablet Take 0.5 mg by mouth daily.    !! LORazepam (ATIVAN) 1 MG tablet Take 1 mg by mouth every 4 (four) hours as needed for anxiety (and agitation).    Multiple Vitamin (MULTIVITAMIN WITH MINERALS) TABS tablet Take 1 tablet by mouth daily.    OLANZapine (ZYPREXA) 15 MG tablet Take 30 mg by mouth at  bedtime.     pantoprazole (PROTONIX) 40 MG tablet Take 40 mg by mouth daily with breakfast.    solifenacin (VESICARE) 10 MG tablet Take 10 mg by mouth daily.    thiothixene (NAVANE) 10 MG capsule Take 10 mg by mouth 2 (two) times daily.      Discharge Medications: Please see discharge summary for a list of discharge medications.  Relevant Imaging Results:  Relevant Lab Results:   Additional Information SS#: [id number]  Burna Sis, LCSW

## 2016-11-13 NOTE — Discharge Instructions (Signed)
Cellulitis, Adult Cellulitis is a skin infection. The infected area is usually red and sore. This condition occurs most often in the arms and lower legs. It is very important to get treated for this condition. Follow these instructions at home:  Take over-the-counter and prescription medicines only as told by your doctor.  If you were prescribed an antibiotic medicine, take it as told by your doctor. Do not stop taking the antibiotic even if you start to feel better.  Drink enough fluid to keep your pee (urine) clear or pale yellow.  Do not touch or rub the infected area.  Raise (elevate) the infected area above the level of your heart while you are sitting or lying down.  Place warm or cold wet cloths (warm or cold compresses) on the infected area. Do this as told by your doctor.  Keep all follow-up visits as told by your doctor. This is important. These visits let your doctor make sure your infection is not getting worse. Contact a doctor if:  You have a fever.  Your symptoms do not get better after 1-2 days of treatment.  Your bone or joint under the infected area starts to hurt after the skin has healed.  Your infection comes back. This can happen in the same area or another area.  You have a swollen bump in the infected area.  You have new symptoms.  You feel ill and also have muscle aches and pains. Get help right away if:  Your symptoms get worse.  You feel very sleepy.  You throw up (vomit) or have watery poop (diarrhea) for a long time.  There are red streaks coming from the infected area.  Your red area gets larger.  Your red area turns darker. This information is not intended to replace advice given to you by your health care provider. Make sure you discuss any questions you have with your health care provider. Document Released: 01/27/2008 Document Revised: 01/16/2016 Document Reviewed: 06/19/2015 Elsevier Interactive Patient Education  2017 Elsevier  Inc.  

## 2016-11-13 NOTE — Consult Note (Signed)
WOC Nurse wound consult note Reason for Consult: Cellulitis without wounds Wound type:infectious Pressure Injury POA: No Measurement:No wounds Wound bed:No wounds Drainage (amount, consistency, odor) None Periwound:Bilateral LEs with edema (improved since admission and elevation), erythema and warmth. Dressing procedure/placement/frequency: Antibiotic coverage is in place, patient is in bed with LEs elevated.  I have provided Nursing with Orders for a pressure redistribution chair cushion for use when OOB in chair and director to float heels with LE elevation so that pressure injuries do not occur to the heels. WOC nursing team will not follow, but will remain available to this patient, the nursing and medical teams.  Please re-consult if needed. Thanks, Ladona MowLaurie Arneisha Kincannon, MSN, RN, GNP, Hans EdenCWOCN, CWON-AP, FAAN  Pager# [phone]

## 2016-11-13 NOTE — Progress Notes (Addendum)
Patient will discharge to [facility] Anticipated discharge date: 3/23 Family notified: pt to notify Transportation by PTAR- called at 3pm  CSW signing off.  Burna SisJenna H. Marquie Aderhold, LCSW Clinical Social Worker [phone]

## 2016-11-14 LAB — HIV ANTIBODY (ROUTINE TESTING W REFLEX): HIV Screen 4th Generation wRfx: NONREACTIVE

## 2016-11-14 LAB — URINE CULTURE: CULTURE: NO GROWTH

## 2016-11-18 ENCOUNTER — Encounter (HOSPITAL_COMMUNITY): Payer: Self-pay | Admitting: Emergency Medicine

## 2016-11-18 ENCOUNTER — Emergency Department (HOSPITAL_COMMUNITY)
Admission: EM | Admit: 2016-11-18 | Discharge: 2016-11-18 | Disposition: A | Discharge status: Home / Self Care | Payer: Medicare HMO | Attending: Emergency Medicine | Admitting: Emergency Medicine

## 2016-11-18 DIAGNOSIS — R600 Localized edema: Secondary | ICD-10-CM | POA: Insufficient documentation

## 2016-11-18 DIAGNOSIS — Z8673 Personal history of transient ischemic attack (TIA), and cerebral infarction without residual deficits: Secondary | ICD-10-CM | POA: Diagnosis not present

## 2016-11-18 DIAGNOSIS — M7989 Other specified soft tissue disorders: Secondary | ICD-10-CM | POA: Diagnosis present

## 2016-11-18 DIAGNOSIS — Z7982 Long term (current) use of aspirin: Secondary | ICD-10-CM | POA: Diagnosis not present

## 2016-11-18 DIAGNOSIS — Z79899 Other long term (current) drug therapy: Secondary | ICD-10-CM | POA: Insufficient documentation

## 2016-11-18 DIAGNOSIS — F17210 Nicotine dependence, cigarettes, uncomplicated: Secondary | ICD-10-CM | POA: Insufficient documentation

## 2016-11-18 LAB — BASIC METABOLIC PANEL
ANION GAP: 4 — AB (ref 5–15)
BUN: 14 mg/dL (ref 6–20)
CALCIUM: 9 mg/dL (ref 8.9–10.3)
CO2: 27 mmol/L (ref 22–32)
Chloride: 110 mmol/L (ref 101–111)
Creatinine, Ser: 0.76 mg/dL (ref 0.44–1.00)
GFR calc non Af Amer: >60 mL/min (ref >60)
Glucose, Bld: 92 mg/dL (ref 65–99)
Potassium: 4.2 mmol/L (ref 3.5–5.1)
Sodium: 141 mmol/L (ref 135–145)

## 2016-11-18 LAB — CULTURE, BLOOD (ROUTINE X 2)
CULTURE: NO GROWTH
Culture: NO GROWTH

## 2016-11-18 LAB — CBC WITH DIFFERENTIAL/PLATELET
BASOS ABS: 0 10*3/uL (ref 0.0–0.1)
BASOS PCT: 0 %
Eosinophils Absolute: 0.2 10*3/uL (ref 0.0–0.7)
Eosinophils Relative: 2 %
HEMATOCRIT: 29.3 % — AB (ref 36.0–46.0)
Hemoglobin: 9.3 g/dL — ABNORMAL LOW (ref 12.0–15.0)
Lymphocytes Relative: 57 %
Lymphs Abs: 3.5 10*3/uL (ref 0.7–4.0)
MCH: 26.5 pg (ref 26.0–34.0)
MCHC: 31.7 g/dL (ref 30.0–36.0)
MCV: 83.5 fL (ref 78.0–100.0)
MONOS PCT: 10 %
Monocytes Absolute: 0.6 10*3/uL (ref 0.1–1.0)
NEUTROS ABS: 1.9 10*3/uL (ref 1.7–7.7)
Neutrophils Relative %: 31 %
Platelets: 246 10*3/uL (ref 150–400)
RBC: 3.51 MIL/uL — ABNORMAL LOW (ref 3.87–5.11)
RDW: 18.3 % — ABNORMAL HIGH (ref 11.5–15.5)
WBC: 6.3 10*3/uL (ref 4.0–10.5)

## 2016-11-18 MED ORDER — FUROSEMIDE 10 MG/ML IJ SOLN
40.0000 mg | Freq: Once | INTRAMUSCULAR | Status: AC
  Administered 2016-11-18: 40 mg via INTRAVENOUS
  Filled 2016-11-18: qty 4

## 2016-11-18 MED ORDER — FUROSEMIDE 40 MG PO TABS: 40.0000 mg | ORAL_TABLET | Freq: Every day | ORAL | 0 refills | Status: AC

## 2016-11-18 NOTE — Discharge Instructions (Signed)
Lasix as prescribed.  Follow-up with your primary Dr. in the next week for a recheck, and return to the ER if symptoms significantly worsen or change.

## 2016-11-18 NOTE — ED Provider Notes (Signed)
[facility]-EMERGENCY DEPT Provider Note   CSN: [id number] Arrival date & time: 11/18/16  0508     History   Chief Complaint Chief Complaint  Patient presents with  . Leg Swelling    HPI Kenetha Cozza is a 66 y.o. female.  Patient is a 66 year old female with past medical history of bipolar disorder, CVA, GERD. She presents for evaluation of lower extremity. She apparently slid out of her chair at the assisted living facility, but denies any injury. She reports swollen legs for the past several days. She denies any chest pain or shortness of breath.   The history is provided by the patient.    Past Medical History:  Diagnosis Date  . Bipolar 1 disorder (HCC)   . GERD (gastroesophageal reflux disease)   . Hemiplegia (HCC)    L side  . Osteoarthritis   . Stroke [facility])     Patient Active Problem List   Diagnosis Date Noted  . Bipolar affective disorder (HCC)   . Gastroesophageal reflux disease   . Cellulitis of right leg 11/12/2016  . Chronic venous stasis dermatitis of both lower extremities 11/12/2016  . Left leg cellulitis 11/12/2016  . Pneumomediastinum (HCC) 07/16/2016    Past Surgical History:  Procedure Laterality Date  . KNEE ARTHROSCOPY      OB History    No data available       Home Medications    Prior to Admission medications   Medication Sig Start Date End Date Taking? Authorizing Provider  acetaminophen (TYLENOL) 500 MG tablet Take 500 mg by mouth every 6 (six) hours as needed for mild pain, moderate pain or fever. Do not exceed 3gm/24h    Historical Provider, MD  aspirin EC 81 MG tablet Take 81 mg by mouth daily with breakfast.    Historical Provider, MD  atorvastatin (LIPITOR) 40 MG tablet Take 40 mg by mouth every evening.    Historical Provider, MD  diclofenac sodium (VOLTAREN) 1 % GEL Apply 4 g topically 4 (four) times daily. 07/21/16   Sharlene Dory, PA-C  divalproex (DEPAKOTE) 500 MG DR tablet Take 500-1,000 mg by mouth 2 (two) times daily.  500 MG in the morning and 1000 MG in the evening    Historical Provider, MD  doxycycline (VIBRA-TABS) 100 MG tablet Take 1 tablet (100 mg total) by mouth 2 (two) times daily. 11/13/16 11/23/16  Vassie Loll, MD  LORazepam (ATIVAN) 0.5 MG tablet Take 0.5 mg by mouth daily.    Historical Provider, MD  LORazepam (ATIVAN) 1 MG tablet Take 1 mg by mouth every 4 (four) hours as needed for anxiety (and agitation).    Historical Provider, MD  Multiple Vitamin (MULTIVITAMIN WITH MINERALS) TABS tablet Take 1 tablet by mouth daily.    Historical Provider, MD  OLANZapine (ZYPREXA) 15 MG tablet Take 30 mg by mouth at bedtime.     Historical Provider, MD  pantoprazole (PROTONIX) 40 MG tablet Take 40 mg by mouth daily with breakfast.    Historical Provider, MD  solifenacin (VESICARE) 10 MG tablet Take 10 mg by mouth daily.    Historical Provider, MD  thiothixene (NAVANE) 10 MG capsule Take 10 mg by mouth 2 (two) times daily.    Historical Provider, MD    Family History Family History  Problem Relation Age of Onset  . Cancer Father   . Stroke Father     Social History Social History  Substance Use Topics  . Smoking status: Current Every Day Smoker  Types: Cigarettes  . Smokeless tobacco: Never Used  . Alcohol use No     Allergies   Penicillins   Review of Systems Review of Systems  All other systems reviewed and are negative.    Physical Exam Updated Vital Signs BP 118/65 (BP Location: Right Arm)   Pulse 60   Temp 97.8 F (36.6 C) (Oral)   Resp 18   Ht 5\' 6"  (1.676 m)   Wt 210 lb (95.3 kg)   SpO2 98%   BMI 33.89 kg/m   Physical Exam  Constitutional: She is oriented to person, place, and time. She appears well-developed and well-nourished. No distress.  HENT:  Head: Normocephalic and atraumatic.  Neck: Normal range of motion. Neck supple.  Cardiovascular: Normal rate and regular rhythm.  Exam reveals no gallop and no friction rub.   No murmur heard. Pulmonary/Chest: Effort  normal and breath sounds normal. No respiratory distress. She has no wheezes.  Abdominal: Soft. Bowel sounds are normal. She exhibits no distension. There is no tenderness.  Musculoskeletal: Normal range of motion. She exhibits edema.  There is 2+ pitting edema of both lower extremities.  Neurological: She is alert and oriented to person, place, and time.  Skin: Skin is warm and dry. She is not diaphoretic.  Nursing note and vitals reviewed.    ED Treatments / Results  Labs (all labs ordered are listed, but only abnormal results are displayed) Labs Reviewed  BASIC METABOLIC PANEL  CBC WITH DIFFERENTIAL/PLATELET    EKG  EKG Interpretation None       Radiology No results found.  Procedures Procedures (including critical care time)  Medications Ordered in ED Medications  furosemide (LASIX) injection 40 mg (not administered)     Initial Impression / Assessment and Plan / ED Course  I have reviewed the triage vital signs and the nursing notes.  Pertinent labs & imaging results that were available during my care of the patient were reviewed by me and considered in my medical decision making (see chart for details).  Patient presents for evaluation of lower extremity edema. The swelling is bilateral and from reading her chart, sounds chronic. She was recently admitted for cellulitis, however I see no evidence for redness, warmth, or other findings that would be consistent with a recurrence of this. Her laboratory studies revealed no white count and she is afebrile. Her renal function is normal. She was given IV Lasix here in the ER and I believe is appropriate for discharge. I will start her on a dose of Lasix which she can take at home.  Final Clinical Impressions(s) / ED Diagnoses   Final diagnoses:  None    New Prescriptions New Prescriptions   No medications on file     Geoffery Lyonsouglas Adie Vilar, MD 11/18/16 [id number]

## 2016-11-18 NOTE — ED Triage Notes (Signed)
Pt bib [facility] from [facility] c/o bilateral lower leg swelling and foot pain. Pt got out of bed this evening to let nurses know she was having pain in her feet and slipped on her comforter and fell. Pt has no complaints from fall, primary complaint is edema. Pt was seen at PCP Monday and given rx for compressions stockings but patient hasn't been able to get them yet. Pt possibly has bed bugs due to outbreak at facility,  none have been seen by EMS.

## 2016-11-18 NOTE — ED Notes (Signed)
[facility] called for transportation  

## 2016-11-18 NOTE — ED Notes (Signed)
Attempted to call report to [facility], no answer.

## 2017-03-08 ENCOUNTER — Encounter: Payer: Self-pay | Admitting: Podiatry

## 2017-03-08 ENCOUNTER — Ambulatory Visit (INDEPENDENT_AMBULATORY_CARE_PROVIDER_SITE_OTHER): Payer: Medicare HMO | Admitting: Podiatry

## 2017-03-08 DIAGNOSIS — L603 Nail dystrophy: Secondary | ICD-10-CM | POA: Diagnosis not present

## 2017-03-14 NOTE — Progress Notes (Signed)
   HPI: She is a 66-year-old female presents today for evaluation of a dystrophic nail to the fifth digit of the right foot. Patient denies trauma however she believes her toenails been disarticulated this for several months. Patient presents today for further treatment and evaluation   Physical Exam: General: The patient is alert and oriented x3 in no acute distress.  Dermatology: Hyperkeratotic dystrophic toenail noted to the fifth digit right foot. Sensitive to palpation Skin is warm, dry and supple bilateral lower extremities. Negative for open lesions or macerations.  Vascular: Palpable pedal pulses bilaterally. No edema or erythema noted. Capillary refill within normal limits.  Neurological: Epicritic and protective threshold grossly intact bilaterally.   Musculoskeletal Exam: Range of motion within normal limits to all pedal and ankle joints bilateral. Muscle strength 5/5 in all groups bilateral.   Assessment: 1. Dystrophic toenail fifth digit right foot   Plan of Care:  1. Patient was evaluated. 2. Mechanical debridement of the toenails performed using a nail nipper without incident or bleeding 3. Return to clinic when necessary   Felecia ShellingBrent M. Lennan Malone, DPM [facility]  Dr. Felecia ShellingBrent M. Adraine Biffle, DPM    [address]                                        [place], [place] [id number]                Office [phone]  Fax [phone]

## 2017-05-24 ENCOUNTER — Emergency Department (HOSPITAL_COMMUNITY)
Admission: EM | Admit: 2017-05-24 | Discharge: 2017-05-26 | Disposition: A | Discharge status: Other Acute Inpatient Hospital | Payer: Medicare HMO | Attending: Emergency Medicine | Admitting: Emergency Medicine

## 2017-05-24 ENCOUNTER — Encounter (HOSPITAL_COMMUNITY): Payer: Self-pay | Admitting: *Deleted

## 2017-05-24 DIAGNOSIS — Z79899 Other long term (current) drug therapy: Secondary | ICD-10-CM | POA: Insufficient documentation

## 2017-05-24 DIAGNOSIS — R4689 Other symptoms and signs involving appearance and behavior: Secondary | ICD-10-CM

## 2017-05-24 DIAGNOSIS — R4586 Emotional lability: Secondary | ICD-10-CM | POA: Diagnosis not present

## 2017-05-24 DIAGNOSIS — F310 Bipolar disorder, current episode hypomanic: Secondary | ICD-10-CM | POA: Diagnosis not present

## 2017-05-24 DIAGNOSIS — F3113 Bipolar disorder, current episode manic without psychotic features, severe: Secondary | ICD-10-CM | POA: Diagnosis not present

## 2017-05-24 DIAGNOSIS — Z880 Allergy status to penicillin: Secondary | ICD-10-CM | POA: Diagnosis not present

## 2017-05-24 DIAGNOSIS — F17210 Nicotine dependence, cigarettes, uncomplicated: Secondary | ICD-10-CM | POA: Diagnosis not present

## 2017-05-24 DIAGNOSIS — Z8673 Personal history of transient ischemic attack (TIA), and cerebral infarction without residual deficits: Secondary | ICD-10-CM | POA: Diagnosis not present

## 2017-05-24 DIAGNOSIS — Z7982 Long term (current) use of aspirin: Secondary | ICD-10-CM | POA: Diagnosis not present

## 2017-05-24 DIAGNOSIS — D649 Anemia, unspecified: Secondary | ICD-10-CM

## 2017-05-24 DIAGNOSIS — F319 Bipolar disorder, unspecified: Secondary | ICD-10-CM | POA: Diagnosis present

## 2017-05-24 DIAGNOSIS — Z01818 Encounter for other preprocedural examination: Secondary | ICD-10-CM

## 2017-05-24 DIAGNOSIS — K219 Gastro-esophageal reflux disease without esophagitis: Secondary | ICD-10-CM | POA: Diagnosis not present

## 2017-05-24 LAB — COMPREHENSIVE METABOLIC PANEL
ALT: 19 U/L (ref 14–54)
ANION GAP: 6 (ref 5–15)
AST: 26 U/L (ref 15–41)
Albumin: 3.1 g/dL — ABNORMAL LOW (ref 3.5–5.0)
Alkaline Phosphatase: 88 U/L (ref 38–126)
BILIRUBIN TOTAL: 0.3 mg/dL (ref 0.3–1.2)
BUN: 18 mg/dL (ref 6–20)
CALCIUM: 9.1 mg/dL (ref 8.9–10.3)
CO2: 26 mmol/L (ref 22–32)
Chloride: 107 mmol/L (ref 101–111)
Creatinine, Ser: 0.86 mg/dL (ref 0.44–1.00)
Glucose, Bld: 103 mg/dL — ABNORMAL HIGH (ref 65–99)
Potassium: 4.3 mmol/L (ref 3.5–5.1)
Sodium: 139 mmol/L (ref 135–145)
TOTAL PROTEIN: 7.2 g/dL (ref 6.5–8.1)

## 2017-05-24 LAB — CBC WITH DIFFERENTIAL/PLATELET
BASOS ABS: 0 10*3/uL (ref 0.0–0.1)
BASOS PCT: 0 %
Eosinophils Absolute: 0.1 10*3/uL (ref 0.0–0.7)
Eosinophils Relative: 2 %
HEMATOCRIT: 30.4 % — AB (ref 36.0–46.0)
HEMOGLOBIN: 9.8 g/dL — AB (ref 12.0–15.0)
Lymphocytes Relative: 35 %
Lymphs Abs: 2.8 10*3/uL (ref 0.7–4.0)
MCH: 27.6 pg (ref 26.0–34.0)
MCHC: 32.2 g/dL (ref 30.0–36.0)
MCV: 85.6 fL (ref 78.0–100.0)
Monocytes Absolute: 0.8 10*3/uL (ref 0.1–1.0)
Monocytes Relative: 10 %
NEUTROS ABS: 4.2 10*3/uL (ref 1.7–7.7)
NEUTROS PCT: 53 %
Platelets: 220 10*3/uL (ref 150–400)
RBC: 3.55 MIL/uL — ABNORMAL LOW (ref 3.87–5.11)
RDW: 18.3 % — AB (ref 11.5–15.5)
WBC: 7.8 10*3/uL (ref 4.0–10.5)

## 2017-05-24 LAB — ETHANOL

## 2017-05-24 LAB — VALPROIC ACID LEVEL: VALPROIC ACID LVL: 72 ug/mL (ref 50.0–100.0)

## 2017-05-24 MED ORDER — ALUM & MAG HYDROXIDE-SIMETH 200-200-20 MG/5ML PO SUSP: 30.0000 mL | Freq: Four times a day (QID) | ORAL | Status: DC | PRN

## 2017-05-24 MED ORDER — DARIFENACIN HYDROBROMIDE ER 15 MG PO TB24
15.0000 mg | ORAL_TABLET | Freq: Every day | ORAL | Status: DC
  Filled 2017-05-24 (×2): qty 1

## 2017-05-24 MED ORDER — ADULT MULTIVITAMIN W/MINERALS CH: 1.0000 | ORAL_TABLET | Freq: Every day | ORAL | Status: DC

## 2017-05-24 MED ORDER — LORAZEPAM 0.5 MG PO TABS: 0.5000 mg | ORAL_TABLET | Freq: Every day | ORAL | Status: DC

## 2017-05-24 MED ORDER — PANTOPRAZOLE SODIUM 40 MG PO TBEC: 40.0000 mg | DELAYED_RELEASE_TABLET | Freq: Every day | ORAL | Status: DC

## 2017-05-24 MED ORDER — ACETAMINOPHEN 325 MG PO TABS: 650.0000 mg | ORAL_TABLET | ORAL | Status: DC | PRN

## 2017-05-24 MED ORDER — ATORVASTATIN CALCIUM 40 MG PO TABS
40.0000 mg | ORAL_TABLET | Freq: Every evening | ORAL | Status: DC
  Filled 2017-05-24 (×3): qty 1

## 2017-05-24 MED ORDER — NICOTINE 14 MG/24HR TD PT24: 14.0000 mg | MEDICATED_PATCH | Freq: Every day | TRANSDERMAL | Status: DC

## 2017-05-24 MED ORDER — ZOLPIDEM TARTRATE 5 MG PO TABS: 5.0000 mg | ORAL_TABLET | Freq: Every evening | ORAL | Status: DC | PRN

## 2017-05-24 MED ORDER — OLANZAPINE 10 MG PO TABS: 30.0000 mg | ORAL_TABLET | Freq: Every day | ORAL | Status: DC

## 2017-05-24 MED ORDER — FUROSEMIDE 40 MG PO TABS
40.0000 mg | ORAL_TABLET | Freq: Every day | ORAL | Status: DC
  Filled 2017-05-24: qty 1

## 2017-05-24 MED ORDER — ASPIRIN EC 81 MG PO TBEC
81.0000 mg | DELAYED_RELEASE_TABLET | Freq: Every day | ORAL | Status: DC
  Administered 2017-05-25: 81 mg via ORAL
  Filled 2017-05-24 (×2): qty 1

## 2017-05-24 MED ORDER — DIVALPROEX SODIUM 500 MG PO DR TAB: 1000.0000 mg | DELAYED_RELEASE_TABLET | Freq: Every day | ORAL | Status: DC

## 2017-05-24 MED ORDER — ONDANSETRON HCL 4 MG PO TABS: 4.0000 mg | ORAL_TABLET | Freq: Three times a day (TID) | ORAL | Status: DC | PRN

## 2017-05-24 MED ORDER — DIVALPROEX SODIUM 500 MG PO DR TAB: 500.0000 mg | DELAYED_RELEASE_TABLET | Freq: Every day | ORAL | Status: DC

## 2017-05-24 MED ORDER — LORAZEPAM 1 MG PO TABS
1.0000 mg | ORAL_TABLET | ORAL | Status: DC | PRN
  Administered 2017-05-25: 1 mg via ORAL
  Filled 2017-05-24: qty 1

## 2017-05-24 MED ORDER — THIOTHIXENE 5 MG PO CAPS: 10.0000 mg | ORAL_CAPSULE | Freq: Two times a day (BID) | ORAL | Status: DC

## 2017-05-24 NOTE — ED Notes (Signed)
Patient also too aggressive at this time to update vital signs or to complete an in and out cath.
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
## 2017-05-24 NOTE — BH Assessment (Signed)
[facility] Assessment Progress Note   TTS attempted to assess pt. Pt currently agitated and unable to be moved to the conference room in order to complete assessment. Hardie Lora, RN notified TTS is unable to complete the assessment on the pt in the hallway and pt is currently too agitated to be moved to the conference room. RN questioned if pt can be transferred to [facility]. RN was notified to contact TCU as TTS is unaware of the process when transferring pt's from the hallway to a bed in TCU. Juliette Alcide, RN in [place] notified of the nurse's question for the pt in [facility].  Princess Bruins, MSW, LCSW Therapeutic Triage Specialist  [phone]

## 2017-05-24 NOTE — ED Provider Notes (Signed)
[facility]-EMERGENCY DEPT Provider Note   CSN: [id number] Arrival date & time: 05/24/17  1641     History   Chief Complaint Chief Complaint  Patient presents with  . Aggressive Behavior    HPI Betty Buchanan is a 66 y.o. female.  The history is provided by the nursing home. The history is limited by the condition of the patient (Psychiatric condition).  She was sent from the facility where she lives because she has been agitated for the last 2 days. Patient states that she has no complaints and she was upset because people weren't letting her do her business. She has been compliant with her medications. She does have a known history of bipolar disorder.  Past Medical History:  Diagnosis Date  . Bipolar 1 disorder (HCC)   . GERD (gastroesophageal reflux disease)   . Hemiplegia (HCC)    L side  . Osteoarthritis   . Stroke [facility])     Patient Active Problem List   Diagnosis Date Noted  . Bipolar affective disorder (HCC)   . Gastroesophageal reflux disease   . Cellulitis of right leg 11/12/2016  . Chronic venous stasis dermatitis of both lower extremities 11/12/2016  . Left leg cellulitis 11/12/2016  . Pneumomediastinum (HCC) 07/16/2016    Past Surgical History:  Procedure Laterality Date  . KNEE ARTHROSCOPY      OB History    No data available       Home Medications    Prior to Admission medications   Medication Sig Start Date End Date Taking? Authorizing Provider  acetaminophen (TYLENOL) 500 MG tablet Take 500 mg by mouth every 6 (six) hours as needed for mild pain, moderate pain or fever. Do not exceed 3gm/24h    [provider]  aspirin EC 81 MG tablet Take 81 mg by mouth daily with breakfast.    [provider]  atorvastatin (LIPITOR) 40 MG tablet Take 40 mg by mouth every evening.    [provider]  diclofenac sodium (VOLTAREN) 1 % GEL Apply 4 g topically 4 (four) times daily. 07/21/16   Sharlene Dory, PA-C  divalproex (DEPAKOTE) 500  MG DR tablet Take 500-1,000 mg by mouth 2 (two) times daily. 500 MG in the morning and 1000 MG in the evening    [provider]  furosemide (LASIX) 40 MG tablet Take 1 tablet (40 mg total) by mouth daily. 11/18/16   Geoffery Lyons, MD  LORazepam (ATIVAN) 0.5 MG tablet Take 0.5 mg by mouth daily.    [provider]  LORazepam (ATIVAN) 1 MG tablet Take 1 mg by mouth every 4 (four) hours as needed for anxiety (and agitation).    [provider]  Multiple Vitamin (MULTIVITAMIN WITH MINERALS) TABS tablet Take 1 tablet by mouth daily.    [provider]  OLANZapine (ZYPREXA) 15 MG tablet Take 30 mg by mouth at bedtime.     [provider]  pantoprazole (PROTONIX) 40 MG tablet Take 40 mg by mouth daily with breakfast.    [provider]  solifenacin (VESICARE) 10 MG tablet Take 10 mg by mouth daily.    [provider]  thiothixene (NAVANE) 10 MG capsule Take 10 mg by mouth 2 (two) times daily.    [provider]    Family History Family History  Problem Relation Age of Onset  . Cancer Father   . Stroke Father     Social History Social History  Substance Use Topics  . Smoking status: Current  Every Day Smoker    Types: Cigarettes  . Smokeless tobacco: Never Used  . Alcohol use No     Allergies   Penicillins   Review of Systems Review of Systems  Unable to perform ROS: Psychiatric disorder     Physical Exam Updated Vital Signs BP (!) 122/59 (BP Location: Left Arm)   Pulse 71   Temp 98.4 F (36.9 C) (Oral)   Resp 18   SpO2 100%   Physical Exam  Nursing note and vitals reviewed.  66 year old female, resting comfortably and in no acute distress. Vital signs are normal. Oxygen saturation is 100%, which is normal. Head is normocephalic and atraumatic. PERRLA, EOMI. Oropharynx is clear. Neck is nontender and supple without adenopathy or JVD. Back is nontender and there is no CVA tenderness. Lungs are clear  without rales, wheezes, or rhonchi. Chest is nontender. Heart has regular rate and rhythm without murmur. Abdomen is soft, flat, nontender without masses or hepatosplenomegaly and peristalsis is normoactive. Extremities have 2+ edema, full range of motion is present. Skin is warm and dry without rash. Neurologic: She is awake and alert and oriented, cranial nerves are intact, there are no motor or sensory deficits. Psychiatric: No homicidal or suicidal ideation. Makes relatively poor eye contact and mumbles making it difficult to understand what she is saying. She does have somewhat of a flight of ideas.  ED Treatments / Results  Labs (all labs ordered are listed, but only abnormal results are displayed) Labs Reviewed  COMPREHENSIVE METABOLIC PANEL - Abnormal; Notable for the following:       Result Value   Glucose, Bld 103 (*)    Albumin 3.1 (*)    All other components within normal limits  CBC WITH DIFFERENTIAL/PLATELET - Abnormal; Notable for the following:    RBC 3.55 (*)    Hemoglobin 9.8 (*)    HCT 30.4 (*)    RDW 18.3 (*)    All other components within normal limits  ETHANOL  VALPROIC ACID LEVEL  RAPID URINE DRUG SCREEN, HOSP PERFORMED    Procedures Procedures (including critical care time)  Medications Ordered in ED Medications  nicotine (NICODERM CQ - dosed in mg/24 hours) patch 14 mg (not administered)  alum & mag hydroxide-simeth (MAALOX/MYLANTA) 200-200-20 MG/5ML suspension 30 mL (not administered)  ondansetron (ZOFRAN) tablet 4 mg (not administered)  zolpidem (AMBIEN) tablet 5 mg (not administered)  acetaminophen (TYLENOL) tablet 650 mg (not administered)  aspirin EC tablet 81 mg (not administered)  atorvastatin (LIPITOR) tablet 40 mg (not administered)  furosemide (LASIX) tablet 40 mg (not administered)  LORazepam (ATIVAN) tablet 0.5 mg (not administered)  LORazepam (ATIVAN) tablet 1 mg (not administered)  multivitamin with minerals tablet 1 tablet (not  administered)  OLANZapine (ZYPREXA) tablet 30 mg (not administered)  pantoprazole (PROTONIX) EC tablet 40 mg (not administered)  darifenacin (ENABLEX) 24 hr tablet 15 mg (not administered)  divalproex (DEPAKOTE) DR tablet 500 mg (not administered)  divalproex (DEPAKOTE) DR tablet 1,000 mg (not administered)     Initial Impression / Assessment and Plan / ED Course  I have reviewed the triage vital signs and the nursing notes.  Pertinent labs & imaging results that were available during my care of the patient were reviewed by me and considered in my medical decision making (see chart for details).  Report of agitation not evident on exam here. Old records are reviewed, and I see no relevant past visits. I'll ask for TTS evaluation. Screening labs obtained.  Screening  labs show stable anemia. Urine drug screen is pending. TTS evaluation is pending.  Final Clinical Impressions(s) / ED Diagnoses   Final diagnoses:  Aggressive behavior  Normochromic normocytic anemia    New Prescriptions New Prescriptions   No medications on file     Dione Booze, MD 05/24/17 2358

## 2017-05-24 NOTE — ED Triage Notes (Signed)
Per EMS, pt from [facility], sent for agitation x 2 days. Pt A&0x4.

## 2017-05-24 NOTE — BH Assessment (Signed)
[facility] Assessment Progress Note   TTS went to assess pt. Pt currently in the hallway. Unable to complete the assessment with the pt in the hallway due to confidentiality issues. TTS to assess pt once she is able to be moved to a private room. Unable to locate nurse to notify them that TTS cannot assess pt until she is able to be moved to a private room. TTS will attempt to locate nurse.   Betty Buchanan, MSW, LCSW Therapeutic Triage Specialist  [phone]

## 2017-05-24 NOTE — ED Notes (Signed)
Patient is in the bathroom trying to give urine specimen and also trying to have a bm. Patient did not want to have writer stay in the bathroom with patient because patient needed "privacy" due to "personal business".

## 2017-05-24 NOTE — ED Notes (Signed)
Patient taken to bathroom in a wheel chair and patient could not produce a urine specimen. Patient called for assistance in the bathroom and this writer went to help patient in the bathroom. Patient unhappy that she was having to stand to transfer from toilet to wheel chair. Patient also unhappy with Clinical research associate and began yelling at [employer] "you can't do your damn job and you need to learn how to GD do what I the hell I tell you to do". Writer at this time called for help because writer felt uncomfortable with patient.

## 2017-05-24 NOTE — BH Assessment (Signed)
[facility] Assessment Progress Note   TTS spoke with Hardie Lora, RN to advise pt will need to be transferred to a wheelchair in order to be moved to the conference room to complete assessment. TTS went to [place] [facility] and pt is not in wheelchair to be transferred to the conference room. TTS will attempt to assess pt at a later time once she is able to be moved to a conference room or a private room for the assessment. Pt's gait is unsteady and TTS is not able to transfer the pt to a wheelchair in order to be moved to a conference room or private room to complete the assessment. Assessment must be completed at a later time.  Princess Bruins, MSW, LCSW Therapeutic Triage Specialist  [phone]

## 2017-05-25 ENCOUNTER — Emergency Department (HOSPITAL_COMMUNITY): Payer: Medicare HMO

## 2017-05-25 DIAGNOSIS — G4700 Insomnia, unspecified: Secondary | ICD-10-CM

## 2017-05-25 DIAGNOSIS — F17210 Nicotine dependence, cigarettes, uncomplicated: Secondary | ICD-10-CM | POA: Diagnosis not present

## 2017-05-25 DIAGNOSIS — K219 Gastro-esophageal reflux disease without esophagitis: Secondary | ICD-10-CM | POA: Diagnosis not present

## 2017-05-25 DIAGNOSIS — F22 Delusional disorders: Secondary | ICD-10-CM | POA: Diagnosis not present

## 2017-05-25 DIAGNOSIS — R451 Restlessness and agitation: Secondary | ICD-10-CM | POA: Diagnosis not present

## 2017-05-25 DIAGNOSIS — F310 Bipolar disorder, current episode hypomanic: Secondary | ICD-10-CM | POA: Diagnosis not present

## 2017-05-25 DIAGNOSIS — R4586 Emotional lability: Secondary | ICD-10-CM | POA: Diagnosis not present

## 2017-05-25 DIAGNOSIS — R443 Hallucinations, unspecified: Secondary | ICD-10-CM | POA: Diagnosis not present

## 2017-05-25 LAB — URINALYSIS, ROUTINE W REFLEX MICROSCOPIC
Bilirubin Urine: NEGATIVE
Glucose, UA: NEGATIVE mg/dL
Hgb urine dipstick: NEGATIVE
Ketones, ur: NEGATIVE mg/dL
LEUKOCYTES UA: NEGATIVE
NITRITE: NEGATIVE
PROTEIN: NEGATIVE mg/dL
Specific Gravity, Urine: 1.009 (ref 1.005–1.030)
pH: 7 (ref 5.0–8.0)

## 2017-05-25 LAB — RAPID URINE DRUG SCREEN, HOSP PERFORMED
AMPHETAMINES: NOT DETECTED
Barbiturates: NOT DETECTED
Benzodiazepines: NOT DETECTED
Cocaine: NOT DETECTED
OPIATES: NOT DETECTED
TETRAHYDROCANNABINOL: NOT DETECTED

## 2017-05-25 MED ORDER — DIVALPROEX SODIUM 500 MG PO DR TAB
500.0000 mg | DELAYED_RELEASE_TABLET | Freq: Two times a day (BID) | ORAL | Status: DC
  Administered 2017-05-25 (×2): 500 mg via ORAL
  Filled 2017-05-25 (×2): qty 1

## 2017-05-25 MED ORDER — DIVALPROEX SODIUM 500 MG PO DR TAB: 500.0000 mg | DELAYED_RELEASE_TABLET | Freq: Two times a day (BID) | ORAL | Status: DC

## 2017-05-25 MED ORDER — OLANZAPINE 5 MG PO TABS
7.5000 mg | ORAL_TABLET | Freq: Two times a day (BID) | ORAL | Status: DC
  Administered 2017-05-25: 7.5 mg via ORAL
  Filled 2017-05-25: qty 1

## 2017-05-25 MED ORDER — OLANZAPINE 5 MG PO TABS: 5.0000 mg | ORAL_TABLET | Freq: Every day | ORAL | Status: DC

## 2017-05-25 MED ORDER — TRAZODONE HCL 50 MG PO TABS
50.0000 mg | ORAL_TABLET | Freq: Every day | ORAL | Status: DC
  Administered 2017-05-25: 50 mg via ORAL
  Filled 2017-05-25: qty 1

## 2017-05-25 NOTE — ED Notes (Signed)
Patient in paper scrubs, and wanded per policy.  Patient refuses to allow staff to lock up pocket book. All other belongings are in locker 28.

## 2017-05-25 NOTE — BH Assessment (Signed)
[facility] Assessment Progress Note  Per Thedore Mins, MD, this pt requires psychiatric hospitalization at this time.  The following facilities have been contacted to seek placement for this pt, with results as noted:  Beds available, information sent, decision pending:  Bennie Hind [facility]   At capacity:  [facility] Furnace [facility] [place], [place] Triage Specialist [phone]

## 2017-05-25 NOTE — ED Notes (Signed)
Bed: ZO10 Expected date:  Expected time:  Means of arrival:  Comments: HALL B

## 2017-05-25 NOTE — Progress Notes (Addendum)
CSW received a call from at [facility] who stated if pt's UDS is clear and pt can be IVC'd then [place] can accept her.  Delorise Shiner stated pt cannot be accepted unless she is IVC'd because "in her state pt cannot sign herself in voluntarily".  CSW will consult with the EDP.  6:45 PM EDP requested RN place order for UDS and RN ordered UDS.  EDP will assess pt for possible IVC for placement.  Pt has been referred to four facilities.  9:00 PM CSW spoke with EDP who stated he would speak to the pt regarding IVC shortly.  9:15 PM CSW spoke with Lauren in admissions at [facility] and informed her CSW has UDS results anf that the EDP has yet to speak to the pt regarding IVC.  Lauren stated CSW can send UDS results when pt is IVC'd and IVC paperwork can be faxed, as well.  CSW will continue to follow.  Dorothe Pea. Alayzha An, LCSW, LCAS, CSI Clinical Social Worker Ph: [phone]

## 2017-05-25 NOTE — ED Notes (Signed)
Patient to room WA28. Patient agreed to take Depakote and ASA, however she refused all other medications, and the EKG.   Patient also refused to allow staff remove personal belongings.

## 2017-05-25 NOTE — ED Notes (Signed)
Pt. non compliant , uncooperative,  Unable to change to paper scrubs , MD and charge Nurse aware.

## 2017-05-25 NOTE — ED Notes (Signed)
Patient still refusing in and out cath.

## 2017-05-25 NOTE — BH Assessment (Signed)
[facility] Assessment Progress Note  Per Donell Sievert, PA pt meets criteria for gero-psych treatment. TTS to seek placement. TTS attempted to notify pt's nurse but was placed on hold and did not receive an answer. TTS also attempted to contact EDP at [phone] but line continues to ring. TTS will attempt to contact ED staff to notify of disposition.  Princess Bruins, MSW, LCSW Therapeutic Triage Specialist  [phone]

## 2017-05-25 NOTE — ED Notes (Signed)
PT UP WALKING IN HALLWAY. PT ATTEMPTING TO WALK IN PT'S ROOM. PT NEEDING RE DIRECTING. PT ABLE TO BE REDIRECTED. PT DECLINES TO TAKE MEDICATION AT THIS TIME STATES NOT THE TIME OF DAY SHE TAKES HER MEDICATIONS. PT ABLE TO COMMUNICATE HER NEEDS .

## 2017-05-25 NOTE — ED Notes (Signed)
Patient transported to X-ray 

## 2017-05-25 NOTE — ED Notes (Signed)
PT DECLINES VITAL SIGNS STATING VITALS HAD BEEN TAKEN

## 2017-05-25 NOTE — ED Notes (Signed)
PT DECLINES MORNING MEDICATIONS. PT STATES NOT THE TIME OF DAY SHE TAKES MEDICATIONS

## 2017-05-26 DIAGNOSIS — F17210 Nicotine dependence, cigarettes, uncomplicated: Secondary | ICD-10-CM

## 2017-05-26 DIAGNOSIS — F3113 Bipolar disorder, current episode manic without psychotic features, severe: Secondary | ICD-10-CM | POA: Diagnosis not present

## 2017-05-26 DIAGNOSIS — F22 Delusional disorders: Secondary | ICD-10-CM | POA: Diagnosis not present

## 2017-05-26 DIAGNOSIS — R454 Irritability and anger: Secondary | ICD-10-CM

## 2017-05-26 DIAGNOSIS — R4586 Emotional lability: Secondary | ICD-10-CM

## 2017-05-26 DIAGNOSIS — R443 Hallucinations, unspecified: Secondary | ICD-10-CM

## 2017-05-26 DIAGNOSIS — K219 Gastro-esophageal reflux disease without esophagitis: Secondary | ICD-10-CM | POA: Diagnosis not present

## 2017-05-26 DIAGNOSIS — F310 Bipolar disorder, current episode hypomanic: Secondary | ICD-10-CM | POA: Diagnosis not present

## 2017-05-26 DIAGNOSIS — R451 Restlessness and agitation: Secondary | ICD-10-CM | POA: Diagnosis not present

## 2017-05-26 NOTE — ED Notes (Signed)
LM with [facility]'s department to arrange transport to [facility]

## 2017-05-26 NOTE — BH Assessment (Signed)
[facility] Assessment Progress Note  Per Thedore Mins, MD, this pt requires psychiatric hospitalization at this time.  Pt presents under IVC initiated by Dr Jannifer Franklin.  At 13:00 Delorise Shiner calls from [facility] to report that pt has been accepted to their facility by Dr Lowanda Foster.  Laveda Abbe, FNP concurs with this decision.  Pt's nurse, Misty Stanley, has been notified, and agrees to call report to [phone].  Pt is to be transported via [facility].  Doylene Canning, MA Triage Specialist [phone]

## 2017-05-26 NOTE — ED Notes (Signed)
Report given to [place], RN-awaiting transport to [facility]

## 2017-05-26 NOTE — Consult Note (Signed)
[facility] Consult   Reason for Consult:  Physically aggressive, mood lability Referring Physician:  EDP Patient Identification: Betty Buchanan MRN:  [id number] Principal Diagnosis: Bipolar affective disorder [facility]) Diagnosis:   Patient Active Problem List   Diagnosis Date Noted  . Bipolar affective disorder ([place]) [F31.9]   . Gastroesophageal reflux disease [K21.9]   . Cellulitis of right leg [L03.115] 11/12/2016  . 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 Left leg cellulitis [J09.326] 11/12/2016  . Pneumomediastinum ([place]) [J98.2] 07/16/2016    Total Time spent with patient: 45 minutes  Subjective:   Betty Buchanan is a 66 y.o. female patient admitted with agitation.  HPI:  Patient with long history of Bipolar disorder who was brought to the ED from [facility]. She has been getting verbally/physically aggressive, irritable with people at her nursing home. Also, patient has become more paranoid and psychotic. She has been talking to herself as if responding to internal stimuli, getting into disagreement with her roommates and claiming that she can see into the future through her Horoscope.  Past Psychiatric History: as above  Risk to Self: Suicidal Ideation: No (DENIES) Suicidal Intent: No Is patient at risk for suicide?: No Suicidal Plan?: No Access to Means: No What has been your use of drugs/alcohol within the last 12 months?: DENIES Triggers for Past Attempts: None known Intentional Self Injurious Behavior: None Risk to Others: Homicidal Ideation: No Thoughts of Harm to Others: No-Not Currently Present/Within Last 6 Months Current Homicidal Intent: No Current Homicidal Plan: No Access to Homicidal Means: No History of harm to others?: Yes Assessment of Violence: On admission Violent Behavior Description: pt aggressive in ED and cursing at staff, pt admits she got into a "disagreement" with her  roommate PTA  Does patient have access to weapons?: No Criminal Charges Pending?: No Does patient have a court date: No Prior Inpatient Therapy: Prior Inpatient Therapy:  (UTA) Prior Outpatient Therapy: Prior Outpatient Therapy:  (UTA) Does patient have an ACCT team?: Unknown Does patient have Intensive In-House Services?  : Unknown Does patient have [facility] services? : Unknown Does patient have P4CC services?: Unknown  Past Medical History:  Past Medical History:  Diagnosis Date  . Bipolar 1 disorder ([place])   . GERD (gastroesophageal reflux disease)   . Hemiplegia (HCC)    L side  . Osteoarthritis   . Stroke [facility])     Past Surgical History:  Procedure Laterality Date  . KNEE ARTHROSCOPY     Family History:  Family History  Problem Relation Age of Onset  . Cancer Father   . Stroke Father    Family Psychiatric  History:  Social History:  History  Alcohol Use No     History  Drug Use No    Social History   Social History  . Marital status: Single    Spouse name: N/A  . Number of children: N/A  . Years of education: N/A   Social History Main Topics  . Smoking status: Current Every Day Smoker    Types: Cigarettes  . Smokeless tobacco: Never Used  . Alcohol use No  . Drug use: No  . Sexual activity: Not Asked   Other Topics Concern  . None   Social History Narrative  . None   Additional Social History:    Allergies:   Allergies  Allergen Reactions  . Penicillins     Unknown reaction Has patient had a PCN reaction causing immediate rash, facial/tongue/throat  swelling, SOB or lightheadedness with hypotension:Unknown Has patient had a PCN reaction causing severe rash involving mucus membranes or skin necrosis:Unknown Has patient had a PCN reaction that required hospitalization:Unknown Has patient had a PCN reaction occurring within the last 10 years:Unknown If all of the above answers are "NO", then may proceed with Cephalosporin use.     Labs:   Results for orders placed or performed during the hospital encounter of 05/24/17 (from the past 48 hour(s))  Comprehensive metabolic panel     Status: Abnormal   Collection Time: 05/24/17  5:24 PM  Result Value Ref Range   Sodium 139 135 - 145 mmol/L   Potassium 4.3 3.5 - 5.1 mmol/L   Chloride 107 101 - 111 mmol/L   CO2 26 22 - 32 mmol/L   Glucose, Bld 103 (H) 65 - 99 mg/dL   BUN 18 6 - 20 mg/dL   Creatinine, Ser 0.86 0.44 - 1.00 mg/dL   Calcium 9.1 8.9 - 10.3 mg/dL   Total Protein 7.2 6.5 - 8.1 g/dL   Albumin 3.1 (L) 3.5 - 5.0 g/dL   AST 26 15 - 41 U/L   ALT 19 14 - 54 U/L   Alkaline Phosphatase 88 38 - 126 U/L   Total Bilirubin 0.3 0.3 - 1.2 mg/dL   GFR calc non Af Amer >60 >60 mL/min   GFR calc Af Amer >60 >60 mL/min    Comment: (NOTE) The eGFR has been calculated using the CKD EPI equation. This calculation has not been validated in all clinical situations. eGFR's persistently <60 mL/min signify possible Chronic Kidney Disease.    Anion gap 6 5 - 15  CBC with Differential     Status: Abnormal   Collection Time: 05/24/17  5:24 PM  Result Value Ref Range   WBC 7.8 4.0 - 10.5 K/uL   RBC 3.55 (L) 3.87 - 5.11 MIL/uL   Hemoglobin 9.8 (L) 12.0 - 15.0 g/dL   HCT 30.4 (L) 36.0 - 46.0 %   MCV 85.6 78.0 - 100.0 fL   MCH 27.6 26.0 - 34.0 pg   MCHC 32.2 30.0 - 36.0 g/dL   RDW 18.3 (H) 11.5 - 15.5 %   Platelets 220 150 - 400 K/uL   Neutrophils Relative % 53 %   Neutro Abs 4.2 1.7 - 7.7 K/uL   Lymphocytes Relative 35 %   Lymphs Abs 2.8 0.7 - 4.0 K/uL   Monocytes Relative 10 %   Monocytes Absolute 0.8 0.1 - 1.0 K/uL   Eosinophils Relative 2 %   Eosinophils Absolute 0.1 0.0 - 0.7 K/uL   Basophils Relative 0 %   Basophils Absolute 0.0 0.0 - 0.1 K/uL  Ethanol     Status: None   Collection Time: 05/24/17  5:24 PM  Result Value Ref Range   Alcohol, Ethyl (B) <10 <10 mg/dL    Comment:        LOWEST DETECTABLE LIMIT FOR SERUM ALCOHOL IS 10 mg/dL FOR MEDICAL PURPOSES  ONLY Please note change in reference range.   Valproic acid level     Status: None   Collection Time: 05/24/17  5:25 PM  Result Value Ref Range   Valproic Acid Lvl 72 50.0 - 100.0 ug/mL  Urine rapid drug screen (hosp performed)     Status: None   Collection Time: 05/25/17  4:50 PM  Result Value Ref Range   Opiates NONE DETECTED NONE DETECTED   Cocaine NONE DETECTED NONE DETECTED   Benzodiazepines NONE DETECTED NONE DETECTED  Amphetamines NONE DETECTED NONE DETECTED   Tetrahydrocannabinol NONE DETECTED NONE DETECTED   Barbiturates NONE DETECTED NONE DETECTED    Comment:        DRUG SCREEN FOR MEDICAL PURPOSES ONLY.  IF CONFIRMATION IS NEEDED FOR ANY PURPOSE, NOTIFY LAB WITHIN 5 DAYS.        LOWEST DETECTABLE LIMITS FOR URINE DRUG SCREEN Drug Class       Cutoff (ng/mL) Amphetamine      1000 Barbiturate      200 Benzodiazepine   694 Tricyclics       854 Opiates          300 Cocaine          300 THC              50   Urinalysis, Routine w reflex microscopic     Status: None   Collection Time: 05/25/17  4:50 PM  Result Value Ref Range   Color, Urine YELLOW YELLOW   APPearance CLEAR CLEAR   Specific Gravity, Urine 1.009 1.005 - 1.030   pH 7.0 5.0 - 8.0   Glucose, UA NEGATIVE NEGATIVE mg/dL   Hgb urine dipstick NEGATIVE NEGATIVE   Bilirubin Urine NEGATIVE NEGATIVE   Ketones, ur NEGATIVE NEGATIVE mg/dL   Protein, ur NEGATIVE NEGATIVE mg/dL   Nitrite NEGATIVE NEGATIVE   Leukocytes, UA NEGATIVE NEGATIVE    Current Facility-Administered Medications  Medication Dose Route Frequency Provider Last Rate Last Dose  . acetaminophen (TYLENOL) tablet 650 mg  650 mg Oral O2V PRN Delora Fuel, MD      . alum & mag hydroxide-simeth (MAALOX/MYLANTA) 200-200-20 MG/5ML suspension 30 mL  30 mL Oral O3J PRN Delora Fuel, MD      . aspirin EC tablet 81 mg  81 mg Oral Q breakfast Delora Fuel, MD   81 mg at 05/25/17 1254  . atorvastatin (LIPITOR) tablet 40 mg  40 mg Oral QPM Delora Fuel,  MD      . darifenacin (ENABLEX) 24 hr tablet 15 mg  15 mg Oral Daily Delora Fuel, MD      . divalproex (DEPAKOTE) DR tablet 500 mg  500 mg Oral BID Nadia Viar, MD   500 mg at 05/25/17 2159  . furosemide (LASIX) tablet 40 mg  40 mg Oral Daily Delora Fuel, MD      . LORazepam (ATIVAN) tablet 1 mg  1 mg Oral K0X PRN Delora Fuel, MD   1 mg at 05/25/17 0249  . multivitamin with minerals tablet 1 tablet  1 tablet Oral Daily Delora Fuel, MD      . nicotine (NICODERM CQ - dosed in mg/24 hours) patch 14 mg  14 mg Transdermal Daily Delora Fuel, MD      . OLANZapine [facility]) tablet 7.5 mg  7.5 mg Oral BID Darleene Cleaver, Geneal Huebert, MD   7.5 mg at 05/25/17 2159  . ondansetron (ZOFRAN) tablet 4 mg  4 mg Oral F8H PRN Delora Fuel, MD      . pantoprazole (PROTONIX) EC tablet 40 mg  40 mg Oral Q breakfast Delora Fuel, MD      . traZODone (DESYREL) tablet 50 mg  50 mg Oral QHS Corena Pilgrim, MD   50 mg at 05/25/17 2159   Current Outpatient Prescriptions  Medication Sig Dispense Refill  . acetaminophen (TYLENOL) 500 MG tablet Take 500 mg by mouth every 6 (six) hours as needed for mild pain, moderate pain or fever. Do not exceed 3gm/24h    . aspirin EC 81  MG tablet Take 81 mg by mouth daily with breakfast.    . atorvastatin (LIPITOR) 40 MG tablet Take 40 mg by mouth every evening.    . diclofenac sodium (VOLTAREN) 1 % GEL Apply 4 g topically 4 (four) times daily. 100 g 0  . divalproex (DEPAKOTE) 500 MG DR tablet Take 500-1,000 mg by mouth 2 (two) times daily. 500 MG in the morning and 1000 MG in the evening    . docusate sodium (DOK) 100 MG capsule Take 100 mg by mouth 2 (two) times daily.    . fluPHENAZine (PROLIXIN) 2.5 MG tablet Take 2.5 mg by mouth 2 (two) times daily.     . furosemide (LASIX) 40 MG tablet Take 1 tablet (40 mg total) by mouth daily. 10 tablet 0  . lactulose (CHRONULAC) 10 GM/15ML solution     . LORazepam (ATIVAN) 0.5 MG tablet Take 0.5 mg by mouth daily.    . Multiple Vitamin  (MULTIVITAMIN WITH MINERALS) TABS tablet Take 1 tablet by mouth daily.    Marland Kitchen OLANZapine (ZYPREXA) 5 MG tablet Take 5 mg by mouth at bedtime.    . pantoprazole (PROTONIX) 40 MG tablet Take 40 mg by mouth daily with breakfast.    . solifenacin (VESICARE) 10 MG tablet Take 10 mg by mouth daily.      Musculoskeletal: Strength & Muscle Tone: within normal limits Gait & Station: normal Patient leans: N/A  Psychiatric Specialty Exam: Physical Exam  Psychiatric: Her affect is labile. Her speech is rapid and/or pressured. She is agitated, aggressive and actively hallucinating. Thought content is paranoid. Cognition and memory are normal. She expresses impulsivity.    Review of Systems  Constitutional: Negative.   HENT: Negative.   Eyes: Negative.   Respiratory: Negative.   Cardiovascular: Negative.   Gastrointestinal: Negative.   Genitourinary: Negative.   Skin: Negative.   Endo/Heme/Allergies: Negative.   Psychiatric/Behavioral: Positive for hallucinations. The patient is nervous/anxious and has insomnia.     Blood pressure 113/83, pulse (!) 51, temperature 98.6 F (37 C), temperature source Oral, resp. rate 18, SpO2 95 %.There is no height or weight on file to calculate BMI.  General Appearance: Casual  Eye Contact:  Minimal  Speech:  Pressured  Volume:  Increased  Mood:  Irritable  Affect:  Labile  Thought Process:  Disorganized  Orientation:  Full (Time, Place, and Person)  Thought Content:  Delusions and Hallucinations: Auditory  Suicidal Thoughts:  No  Homicidal Thoughts:  No  Memory:  Immediate;   Fair Recent;   Fair Remote;   Fair  Judgement:  Poor  Insight:  Shallow  Psychomotor Activity:  Increased  Concentration:  Concentration: Fair and Attention Span: Fair  Recall:  [employer] of Knowledge:  Fair  Language:  Fair  Akathisia:  No  Handed:  Right  AIMS (if indicated):     Assets:  Armed forces logistics/support/administrative officer Social Support  ADL's:  Intact  Cognition:  WNL  Sleep:    poor     Treatment Plan Summary: Daily contact with patient to assess and evaluate symptoms and progress in treatment and Medication management Continue Depakote 500 mg bid for Bipolar, Start Zyprexa 7.5 mg bid for psychosis and Trazodone 50 mg qhs. Discontinue Proxilin.  Disposition: Recommend psychiatric Inpatient admission when medically cleared.  Corena Pilgrim, MD 05/26/2017 11:06 AM

## 2017-06-15 ENCOUNTER — Other Ambulatory Visit: Payer: Self-pay | Admitting: Internal Medicine

## 2017-07-12 ENCOUNTER — Encounter (HOSPITAL_COMMUNITY): Payer: Self-pay | Admitting: Emergency Medicine

## 2017-07-12 ENCOUNTER — Emergency Department (HOSPITAL_COMMUNITY)
Admission: EM | Admit: 2017-07-12 | Discharge: 2017-07-12 | Disposition: A | Discharge status: Home / Self Care | Payer: Medicare HMO | Attending: Emergency Medicine | Admitting: Emergency Medicine

## 2017-07-12 DIAGNOSIS — Z5321 Procedure and treatment not carried out due to patient leaving prior to being seen by health care provider: Secondary | ICD-10-CM | POA: Insufficient documentation

## 2017-07-12 DIAGNOSIS — M79672 Pain in left foot: Secondary | ICD-10-CM | POA: Diagnosis present

## 2017-07-12 NOTE — ED Notes (Signed)
Misty StanleyStacey, RN/Charge nurse notified.

## 2017-07-12 NOTE — ED Notes (Signed)
Patient walked into triage wanting to have an ambulance take [facility] back [facility]. [facility] then [facility]. [facility]. Mia states she would call the transportation bus.

## 2017-07-12 NOTE — ED Triage Notes (Signed)
Per PTAR, left foot pain-has been going on for 5 months-states one of her toes is black-states the nursing facility has not been treating her symptoms

## 2017-10-14 ENCOUNTER — Other Ambulatory Visit: Payer: Self-pay | Admitting: Internal Medicine

## 2017-10-14 DIAGNOSIS — R5381 Other malaise: Secondary | ICD-10-CM

## 2017-11-01 ENCOUNTER — Other Ambulatory Visit: Payer: Self-pay | Admitting: Internal Medicine

## 2017-11-01 DIAGNOSIS — E2839 Other primary ovarian failure: Secondary | ICD-10-CM

## 2017-11-11 ENCOUNTER — Encounter (HOSPITAL_COMMUNITY): Payer: Self-pay | Admitting: Emergency Medicine

## 2017-11-11 ENCOUNTER — Emergency Department (HOSPITAL_COMMUNITY)
Admission: EM | Admit: 2017-11-11 | Discharge: 2017-11-13 | Disposition: A | Discharge status: Other Acute Inpatient Hospital | Payer: Medicare HMO | Attending: Emergency Medicine | Admitting: Emergency Medicine

## 2017-11-11 ENCOUNTER — Other Ambulatory Visit: Payer: Self-pay

## 2017-11-11 DIAGNOSIS — F319 Bipolar disorder, unspecified: Secondary | ICD-10-CM | POA: Diagnosis present

## 2017-11-11 DIAGNOSIS — Z79899 Other long term (current) drug therapy: Secondary | ICD-10-CM | POA: Diagnosis not present

## 2017-11-11 DIAGNOSIS — F3160 Bipolar disorder, current episode mixed, unspecified: Secondary | ICD-10-CM | POA: Diagnosis not present

## 2017-11-11 DIAGNOSIS — F315 Bipolar disorder, current episode depressed, severe, with psychotic features: Secondary | ICD-10-CM | POA: Diagnosis not present

## 2017-11-11 DIAGNOSIS — R4587 Impulsiveness: Secondary | ICD-10-CM | POA: Diagnosis not present

## 2017-11-11 DIAGNOSIS — F29 Unspecified psychosis not due to a substance or known physiological condition: Secondary | ICD-10-CM | POA: Diagnosis present

## 2017-11-11 DIAGNOSIS — Z01818 Encounter for other preprocedural examination: Secondary | ICD-10-CM

## 2017-11-11 DIAGNOSIS — Z7982 Long term (current) use of aspirin: Secondary | ICD-10-CM | POA: Diagnosis not present

## 2017-11-11 DIAGNOSIS — F17210 Nicotine dependence, cigarettes, uncomplicated: Secondary | ICD-10-CM | POA: Insufficient documentation

## 2017-11-11 DIAGNOSIS — Z046 Encounter for general psychiatric examination, requested by authority: Secondary | ICD-10-CM

## 2017-11-11 LAB — ACETAMINOPHEN LEVEL

## 2017-11-11 LAB — BASIC METABOLIC PANEL
ANION GAP: 6 (ref 5–15)
BUN: 16 mg/dL (ref 6–20)
CO2: 26 mmol/L (ref 22–32)
Calcium: 9.3 mg/dL (ref 8.9–10.3)
Chloride: 107 mmol/L (ref 101–111)
Creatinine, Ser: 0.83 mg/dL (ref 0.44–1.00)
GFR calc Af Amer: >60 mL/min (ref >60)
Glucose, Bld: 120 mg/dL — ABNORMAL HIGH (ref 65–99)
Potassium: 4 mmol/L (ref 3.5–5.1)
SODIUM: 139 mmol/L (ref 135–145)

## 2017-11-11 LAB — CBC WITH DIFFERENTIAL/PLATELET
BASOS ABS: 0 10*3/uL (ref 0.0–0.1)
Basophils Relative: 0 %
EOS ABS: 0.2 10*3/uL (ref 0.0–0.7)
EOS PCT: 2 %
HCT: 33.7 % — ABNORMAL LOW (ref 36.0–46.0)
Hemoglobin: 10.5 g/dL — ABNORMAL LOW (ref 12.0–15.0)
Lymphocytes Relative: 41 %
Lymphs Abs: 2.9 10*3/uL (ref 0.7–4.0)
MCH: 26.6 pg (ref 26.0–34.0)
MCHC: 31.2 g/dL (ref 30.0–36.0)
MCV: 85.3 fL (ref 78.0–100.0)
Monocytes Absolute: 0.6 10*3/uL (ref 0.1–1.0)
Monocytes Relative: 9 %
Neutro Abs: 3.4 10*3/uL (ref 1.7–7.7)
Neutrophils Relative %: 48 %
PLATELETS: 208 10*3/uL (ref 150–400)
RBC: 3.95 MIL/uL (ref 3.87–5.11)
RDW: 18.2 % — ABNORMAL HIGH (ref 11.5–15.5)
WBC: 7.1 10*3/uL (ref 4.0–10.5)

## 2017-11-11 LAB — URINALYSIS, ROUTINE W REFLEX MICROSCOPIC
Bacteria, UA: NONE SEEN
Bilirubin Urine: NEGATIVE
Glucose, UA: NEGATIVE mg/dL
Ketones, ur: NEGATIVE mg/dL
Leukocytes, UA: NEGATIVE
Nitrite: NEGATIVE
Protein, ur: NEGATIVE mg/dL
SPECIFIC GRAVITY, URINE: 1.008 (ref 1.005–1.030)
pH: 7 (ref 5.0–8.0)

## 2017-11-11 LAB — RAPID URINE DRUG SCREEN, HOSP PERFORMED
Amphetamines: NOT DETECTED
BARBITURATES: NOT DETECTED
BENZODIAZEPINES: NOT DETECTED
Cocaine: NOT DETECTED
Opiates: NOT DETECTED
Tetrahydrocannabinol: NOT DETECTED

## 2017-11-11 LAB — SALICYLATE LEVEL: Salicylate Lvl: <7 mg/dL (ref 2.8–30.0)

## 2017-11-11 LAB — ETHANOL: Alcohol, Ethyl (B): <10 mg/dL (ref <10)

## 2017-11-11 MED ORDER — TRAZODONE HCL 50 MG PO TABS
50.0000 mg | ORAL_TABLET | Freq: Every evening | ORAL | Status: DC | PRN
  Administered 2017-11-12: 50 mg via ORAL
  Filled 2017-11-11: qty 1

## 2017-11-11 MED ORDER — ATORVASTATIN CALCIUM 40 MG PO TABS
40.0000 mg | ORAL_TABLET | Freq: Every evening | ORAL | Status: DC
  Administered 2017-11-11 – 2017-11-12 (×2): 40 mg via ORAL
  Filled 2017-11-11 (×2): qty 1

## 2017-11-11 MED ORDER — OLANZAPINE 5 MG PO TABS
5.0000 mg | ORAL_TABLET | Freq: Every day | ORAL | Status: DC
  Administered 2017-11-11 – 2017-11-12 (×2): 5 mg via ORAL
  Filled 2017-11-11 (×2): qty 1

## 2017-11-11 MED ORDER — DIVALPROEX SODIUM 125 MG PO CSDR
500.0000 mg | DELAYED_RELEASE_CAPSULE | Freq: Two times a day (BID) | ORAL | Status: DC
  Administered 2017-11-11 – 2017-11-12 (×3): 500 mg via ORAL
  Filled 2017-11-11 (×4): qty 4

## 2017-11-11 MED ORDER — ACETAMINOPHEN 500 MG PO TABS
500.0000 mg | ORAL_TABLET | Freq: Four times a day (QID) | ORAL | Status: DC | PRN
  Administered 2017-11-12 – 2017-11-13 (×2): 500 mg via ORAL
  Filled 2017-11-11 (×2): qty 1

## 2017-11-11 MED ORDER — LORAZEPAM 0.5 MG PO TABS
0.5000 mg | ORAL_TABLET | Freq: Two times a day (BID) | ORAL | Status: DC
  Administered 2017-11-11 – 2017-11-12 (×3): 0.5 mg via ORAL
  Filled 2017-11-11 (×3): qty 1

## 2017-11-11 MED ORDER — FLUPHENAZINE HCL 5 MG PO TABS
5.0000 mg | ORAL_TABLET | Freq: Two times a day (BID) | ORAL | Status: DC
  Administered 2017-11-11 – 2017-11-12 (×3): 5 mg via ORAL
  Filled 2017-11-11 (×4): qty 1

## 2017-11-11 MED ORDER — PANTOPRAZOLE SODIUM 40 MG PO TBEC
40.0000 mg | DELAYED_RELEASE_TABLET | Freq: Every day | ORAL | Status: DC
  Administered 2017-11-12 – 2017-11-13 (×2): 40 mg via ORAL
  Filled 2017-11-11 (×2): qty 1

## 2017-11-11 NOTE — ED Notes (Signed)
Bed: WA31 Expected date:  Expected time:  Means of arrival:  Comments: 
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
## 2017-11-11 NOTE — ED Notes (Signed)
[facility], [phone], spoke with [place].  [facility].  Fax # provided.
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
## 2017-11-11 NOTE — ED Notes (Signed)
Bed: [facility]4 Expected date:  Expected time:  Means of arrival:  Comments: 

## 2017-11-11 NOTE — ED Notes (Signed)
Pt requesting tylenol for pain
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
## 2017-11-12 ENCOUNTER — Other Ambulatory Visit: Payer: Self-pay

## 2017-11-12 ENCOUNTER — Emergency Department (HOSPITAL_COMMUNITY): Payer: Medicare HMO

## 2017-11-12 DIAGNOSIS — R4587 Impulsiveness: Secondary | ICD-10-CM

## 2017-11-12 DIAGNOSIS — F3160 Bipolar disorder, current episode mixed, unspecified: Secondary | ICD-10-CM | POA: Diagnosis not present

## 2017-11-12 DIAGNOSIS — F315 Bipolar disorder, current episode depressed, severe, with psychotic features: Secondary | ICD-10-CM

## 2017-11-12 DIAGNOSIS — F17210 Nicotine dependence, cigarettes, uncomplicated: Secondary | ICD-10-CM

## 2017-11-12 NOTE — BH Assessment (Signed)
[facility]  Per Juanetta BeetsJacqueline Norman, DO, this pt requires psychiatric hospitalization at this time.  Pt presents under IVC initiated by pt residential facility administrator, which Dr Sharma CovertNorman has upheld.  The following facilities have been contacted to seek placement for this pt, with results as noted:  Beds available, information sent, decision pending:  [facility] [address] [facility]   [facility]:  [facility]   [name], [place] [id number] Health Coordinator [phone]

## 2017-11-12 NOTE — BH Assessment (Signed)
[facility] Assessment Progress Note    [facility] Teaching laboratory [facility] [phone]) and left message for need for morning [facility].  Informed him that  Patient was geriatric and could ambulate with her walker.  Asked him to contact [facility] Nurse with any questions or concerns prior to transport.

## 2017-11-12 NOTE — ED Notes (Signed)
Pt's sister, Luis AbedMoinetta, called [phone]. Pt signed for her sister to receive information.

## 2017-11-12 NOTE — ED Notes (Signed)
Report given to RN
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
## 2017-11-12 NOTE — ED Notes (Signed)
SBAR Report received from previous nurse. Pt received calm and visible on unit. Pt denies current SI/ HI, A/V H, depression, anxiety, or pain at this time, and appears otherwise stable and free of distress. Pt reminded of camera surveillance, q 15 min rounds, and rules of the milieu. Will continue to assess. 

## 2017-11-12 NOTE — BH Assessment (Addendum)
[facility] Assessment Progress Note      Delorise ShinerGrace, RN from [facility] [phone]) called to say that patient was accepted for admission to their facility by Dr. Joseph ArtSubedi.  Report will need to be called to [phone] and EMTALA will need to be completed by ED Physician. Patient can be admitted in the morning 11/13/17.

## 2017-11-12 NOTE — Progress Notes (Signed)
[facility]  [facility] Number for report is: [phone] Pt's unit/room/bed number will be: Unit called [facility] Accepting physician: Dr. Barry Dienesowens  To pt's RN, when you call report, please request instructions on how to get to the unit for transporters.  Pt can arrive ASAP on 3/22 or on 3/23  Of note: PLEASE CALL [phone],  IF THERE ARE ANY CHANGES INVOLVING PT'S BEHAVIORS OR REGARDING ANY IM'S RECEIVED.  IF NOT THEN PLEASE CALL REPORT AND TRANSFER WHEN POSSIBLE  CSW will update RN.  Dorothe PeaJonathan F. Anup Brigham, LCSW, LCAS, CSI Clinical Social Worker Ph: [phone]

## 2017-11-13 DIAGNOSIS — F3160 Bipolar disorder, current episode mixed, unspecified: Secondary | ICD-10-CM | POA: Diagnosis not present

## 2017-11-13 LAB — URINE CULTURE: Culture: NO GROWTH

## 2018-02-24 ENCOUNTER — Emergency Department (HOSPITAL_COMMUNITY)
Admission: EM | Admit: 2018-02-24 | Discharge: 2018-02-24 | Disposition: A | Discharge status: Home / Self Care | Payer: Medicare HMO | Attending: Emergency Medicine | Admitting: Emergency Medicine

## 2018-02-24 ENCOUNTER — Encounter (HOSPITAL_COMMUNITY): Payer: Self-pay

## 2018-02-24 ENCOUNTER — Other Ambulatory Visit: Payer: Self-pay

## 2018-02-24 DIAGNOSIS — M79604 Pain in right leg: Secondary | ICD-10-CM | POA: Diagnosis present

## 2018-02-24 DIAGNOSIS — M79605 Pain in left leg: Secondary | ICD-10-CM | POA: Insufficient documentation

## 2018-02-24 DIAGNOSIS — Z8673 Personal history of transient ischemic attack (TIA), and cerebral infarction without residual deficits: Secondary | ICD-10-CM | POA: Diagnosis not present

## 2018-02-24 DIAGNOSIS — F17210 Nicotine dependence, cigarettes, uncomplicated: Secondary | ICD-10-CM | POA: Diagnosis not present

## 2018-02-24 DIAGNOSIS — F319 Bipolar disorder, unspecified: Secondary | ICD-10-CM | POA: Diagnosis not present

## 2018-02-24 DIAGNOSIS — Z7982 Long term (current) use of aspirin: Secondary | ICD-10-CM | POA: Insufficient documentation

## 2018-02-24 DIAGNOSIS — Z79899 Other long term (current) drug therapy: Secondary | ICD-10-CM | POA: Insufficient documentation

## 2018-02-24 HISTORY — DX: Other specified disorders of veins: I87.8

## 2018-02-24 HISTORY — DX: Localized edema: R60.0

## 2018-02-24 MED ORDER — ACETAMINOPHEN 325 MG PO TABS
650.0000 mg | ORAL_TABLET | Freq: Once | ORAL | Status: AC
  Administered 2018-02-24: 650 mg via ORAL
  Filled 2018-02-24: qty 2

## 2018-02-24 NOTE — ED Provider Notes (Signed)
[facility]-EMERGENCY DEPT Provider Note   CSN: [id number] Arrival date & time: 02/24/18  1058     History   Chief Complaint Chief Complaint  Patient presents with  . Leg Pain    HPI Betty Buchanan is a 67 y.o. female.  HPI   The patient is reportedly here for bilateral leg pain.  The patient requests "I want to try Flexeril."  She has had to walk more than usual because the elevator at her nursing facility is not working.  Patient reports to me when I examined her that she did not have any pain at this time.  The patient is unable to give any other specific history or complaint.  Level 5 caveat-altered mental status Past Medical History:  Diagnosis Date  . Bipolar 1 disorder (HCC)   . GERD (gastroesophageal reflux disease)   . Hemiplegia (HCC)    L side  . Hemiplegia (HCC)    left  . Osteoarthritis   . Pedal edema   . Stroke (HCC)   . Venous stasis     Patient Active Problem List   Diagnosis Date Noted  . Bipolar affective disorder (HCC)   . Gastroesophageal reflux disease   . Cellulitis of right leg 11/12/2016  . Chronic venous stasis dermatitis of both lower extremities 11/12/2016  . Left leg cellulitis 11/12/2016  . Pneumomediastinum (HCC) 07/16/2016    Past Surgical History:  Procedure Laterality Date  . KNEE ARTHROSCOPY       OB History   None      Home Medications    Prior to Admission medications   Medication Sig Start Date End Date Taking? Authorizing Provider  acetaminophen (TYLENOL) 500 MG tablet Take 500 mg by mouth every 6 (six) hours as needed for mild pain, moderate pain or fever. Do not exceed 3gm/24h    [provider]  aspirin EC 81 MG tablet Take 81 mg by mouth daily with breakfast.    [provider]  atorvastatin (LIPITOR) 40 MG tablet Take 40 mg by mouth every evening.    [provider]  diclofenac sodium (VOLTAREN) 1 % GEL Apply 4 g topically 4 (four) times daily. 07/21/16   Sharlene Dory, PA-C  divalproex (DEPAKOTE SPRINKLE) 125 MG capsule Take 500 mg by mouth 2 (two) times daily.    [provider]  docusate sodium (DOK) 100 MG capsule Take 100 mg by mouth 2 (two) times daily.    [provider]  fluPHENAZine (PROLIXIN) 5 MG tablet Take 5 mg by mouth 2 (two) times daily.    [provider]  furosemide (LASIX) 40 MG tablet Take 1 tablet (40 mg total) by mouth daily. Patient not taking: Reported on 11/11/2017 11/18/16   Geoffery Lyons, MD  lactulose [facility]) 10 GM/15ML solution  03/05/17   [provider]  LORazepam (ATIVAN) 0.5 MG tablet Take 0.5 mg by mouth 2 (two) times daily.     [provider]  Multiple Vitamin (MULTIVITAMIN WITH MINERALS) TABS tablet Take 1 tablet by mouth daily.    [provider]  OLANZapine (ZYPREXA) 5 MG tablet Take 5 mg by mouth at bedtime.    [provider]  pantoprazole (PROTONIX) 40 MG tablet Take 40 mg by mouth daily with breakfast.    [provider]  solifenacin (VESICARE) 10 MG tablet Take 10 mg by mouth daily.    [provider]  traZODone (DESYREL) 50 MG tablet Take 50 mg by mouth at bedtime  as needed for sleep.    [provider]    Family History Family History  Problem Relation Age of Onset  . Cancer Father   . Stroke Father     Social History Social History   Tobacco Use  . Smoking status: Current Every Day Smoker    Types: Cigarettes  . Smokeless tobacco: Never Used  Substance Use Topics  . Alcohol use: No  . Drug use: No     Allergies   Penicillins   Review of Systems Review of Systems  Unable to perform ROS: Mental status change     Physical Exam Updated Vital Signs BP 115/65 (BP Location: Left Arm)   Pulse (!) 59   Temp 97.7 F (36.5 C) (Oral)   Resp 16   Ht 5\' 5"  (1.651 m)   Wt 93 kg (205 lb)   SpO2 99%   BMI 34.11 kg/m   Physical Exam  Constitutional: She is oriented to person, place, and time. She  appears well-developed and well-nourished.  HENT:  Head: Normocephalic and atraumatic.  Eyes: Pupils are equal, round, and reactive to light. Conjunctivae and EOM are normal.  Neck: Normal range of motion and phonation normal. Neck supple.  Cardiovascular: Normal rate and regular rhythm.  Pulmonary/Chest: Effort normal and breath sounds normal. She exhibits no tenderness.  Abdominal: Soft. She exhibits no distension. There is no tenderness. There is no guarding.  Musculoskeletal: Normal range of motion.  Patient demonstrates ability to move legs equally bilaterally.  With passive range of motion, she does not have any limitations or pain.  Neurological: She is alert and oriented to person, place, and time. She exhibits normal muscle tone.  Skin: Skin is warm and dry.  Psychiatric:  Mildly anxious, she does not appear to be responding to internal stimuli  Nursing note and vitals reviewed.    ED Treatments / Results  Labs (all labs ordered are listed, but only abnormal results are displayed) Labs Reviewed - No data to display  EKG None  Radiology No results found.  Procedures Procedures (including critical care time)  Medications Ordered in ED Medications  acetaminophen (TYLENOL) tablet 650 mg (has no administration in time range)     Initial Impression / Assessment and Plan / ED Course  I have reviewed the triage vital signs and the nursing notes.  Pertinent labs & imaging results that were available during my care of the patient were reviewed by me and considered in my medical decision making (see chart for details).      Patient Vitals for the past 24 hrs:  BP Temp Temp src Pulse Resp SpO2 Height Weight  02/24/18 1130 115/65 97.7 F (36.5 C) Oral (!) 59 16 99 % - -  02/24/18 1127 - - - - - - 5\' 5"  (1.651 m) 93 kg (205 lb)  02/24/18 1109 - - - - - 100 % - -    12:03 PM Reevaluation with update and discussion. After initial assessment and treatment, an updated  evaluation reveals no change in status. Mancel Bale   Medical Decision Making: Nonspecific leg pain, spontaneously resolved at the time of evaluation.  Patient has bipolar disorder which may be involved with her stated discomfort.  She has no clinical or physical evidence for findings of severe acute processes that would require intervention in the emergency department setting.  CRITICAL CARE-no Performed by: Mancel Bale    Final Clinical Impressions(s) / ED Diagnoses   Final diagnoses:  Pain in both  lower extremities    ED Discharge Orders    None       Mancel BaleWentz, Drayk Humbarger, MD 02/24/18 1203

## 2018-02-24 NOTE — ED Triage Notes (Addendum)
Per [facility]- Pt is a FULL CODE. [facility] resides at [facility]. Recent Loss of elevator function at facility. Pt has had to use stairs. Pt denies injury. Pt HX of stroke 2014 with left sided weakness. Pt uses walker to assist. Daughter concerned with peripheral vascular issues. Pt reports has no other complaints.

## 2018-02-24 NOTE — Discharge Instructions (Addendum)
Use Tylenol as needed for leg pain.  Follow-up with your primary care doctor for further evaluation and treatment as needed.

## 2018-02-24 NOTE — ED Notes (Signed)
GINGERALE GIVEN.

## 2018-02-24 NOTE — ED Notes (Signed)
ED Provider at bedside. WENTZ 

## 2018-02-24 NOTE — ED Notes (Signed)
MULTIPLE ATTEMPTS TO COMMUNICATE WITH [facility] WITHOUT SUCCESS. PTAR AND PT GIVEN DISCHARGE INSTRUCTIONS. NO RX GIVEN

## 2018-03-28 ENCOUNTER — Emergency Department (HOSPITAL_COMMUNITY): Payer: Medicare HMO

## 2018-03-28 ENCOUNTER — Encounter (HOSPITAL_COMMUNITY): Payer: Self-pay

## 2018-03-28 ENCOUNTER — Emergency Department (HOSPITAL_COMMUNITY)
Admission: EM | Admit: 2018-03-28 | Discharge: 2018-03-29 | Disposition: A | Discharge status: Home / Self Care | Payer: Medicare HMO | Attending: Emergency Medicine | Admitting: Emergency Medicine

## 2018-03-28 ENCOUNTER — Other Ambulatory Visit: Payer: Self-pay

## 2018-03-28 DIAGNOSIS — F17210 Nicotine dependence, cigarettes, uncomplicated: Secondary | ICD-10-CM | POA: Diagnosis not present

## 2018-03-28 DIAGNOSIS — Z8673 Personal history of transient ischemic attack (TIA), and cerebral infarction without residual deficits: Secondary | ICD-10-CM | POA: Diagnosis not present

## 2018-03-28 DIAGNOSIS — R4182 Altered mental status, unspecified: Secondary | ICD-10-CM | POA: Insufficient documentation

## 2018-03-28 DIAGNOSIS — R4689 Other symptoms and signs involving appearance and behavior: Secondary | ICD-10-CM

## 2018-03-28 LAB — COMPREHENSIVE METABOLIC PANEL
ALBUMIN: 3 g/dL — AB (ref 3.5–5.0)
ALT: 20 U/L (ref 0–44)
AST: 22 U/L (ref 15–41)
Alkaline Phosphatase: 71 U/L (ref 38–126)
Anion gap: 10 (ref 5–15)
BUN: 15 mg/dL (ref 8–23)
CO2: 24 mmol/L (ref 22–32)
Calcium: 9.1 mg/dL (ref 8.9–10.3)
Chloride: 104 mmol/L (ref 98–111)
Creatinine, Ser: 0.82 mg/dL (ref 0.44–1.00)
GFR calc Af Amer: >60 mL/min (ref >60)
GFR calc non Af Amer: >60 mL/min (ref >60)
GLUCOSE: 95 mg/dL (ref 70–99)
POTASSIUM: 4.2 mmol/L (ref 3.5–5.1)
Sodium: 138 mmol/L (ref 135–145)
Total Bilirubin: 0.7 mg/dL (ref 0.3–1.2)
Total Protein: 6.2 g/dL — ABNORMAL LOW (ref 6.5–8.1)

## 2018-03-28 LAB — URINALYSIS, ROUTINE W REFLEX MICROSCOPIC
BILIRUBIN URINE: NEGATIVE
GLUCOSE, UA: NEGATIVE mg/dL
Hgb urine dipstick: NEGATIVE
Ketones, ur: NEGATIVE mg/dL
Leukocytes, UA: NEGATIVE
NITRITE: NEGATIVE
PH: 6 (ref 5.0–8.0)
Protein, ur: NEGATIVE mg/dL
Specific Gravity, Urine: 1.017 (ref 1.005–1.030)

## 2018-03-28 LAB — BRAIN NATRIURETIC PEPTIDE: B Natriuretic Peptide: 15.7 pg/mL (ref 0.0–100.0)

## 2018-03-28 LAB — CBC
HEMATOCRIT: 31.5 % — AB (ref 36.0–46.0)
Hemoglobin: 9.6 g/dL — ABNORMAL LOW (ref 12.0–15.0)
MCH: 26.1 pg (ref 26.0–34.0)
MCHC: 30.5 g/dL (ref 30.0–36.0)
MCV: 85.6 fL (ref 78.0–100.0)
Platelets: 185 10*3/uL (ref 150–400)
RBC: 3.68 MIL/uL — ABNORMAL LOW (ref 3.87–5.11)
RDW: 18.7 % — AB (ref 11.5–15.5)
WBC: 5.6 10*3/uL (ref 4.0–10.5)

## 2018-03-28 LAB — I-STAT TROPONIN, ED: Troponin i, poc: 0 ng/mL (ref 0.00–0.08)

## 2018-03-28 LAB — CBG MONITORING, ED: Glucose-Capillary: 94 mg/dL (ref 70–99)

## 2018-03-28 MED ORDER — ACETAMINOPHEN 325 MG PO TABS
650.0000 mg | ORAL_TABLET | Freq: Once | ORAL | Status: AC
  Administered 2018-03-28: 650 mg via ORAL
  Filled 2018-03-28: qty 2

## 2018-03-28 NOTE — ED Notes (Signed)
Patient transported to X-ray 

## 2018-03-28 NOTE — ED Notes (Signed)
Daughter wishing to speak with case management, CM aware and will come speak with daughter shortly.

## 2018-03-28 NOTE — ED Notes (Signed)
SW at bedside speaking with family.

## 2018-03-28 NOTE — ED Notes (Signed)
PTAR called by NS, pt provided pillow and blanket for comfort

## 2018-03-28 NOTE — ED Provider Notes (Signed)
[facility] Emergency Department Provider Note MRN:  [id number]  Arrival date & time: 03/28/18     Chief Complaint   Altered Mental Status   History of Present Illness   Betty Buchanan is a 67 y.o. year-old female with a history of bipolar disorder, stroke presenting to the ED with chief complaint of altered mental status.  Per report, patient has been not acting her normal self, largely unable to perform ADLs for the past week.  I was unable to obtain an accurate HPI, PMH, or ROS due to the patient's AMS.   Review of Systems  Positive for altered mental status.  Patient's Health History    Past Medical History:  Diagnosis Date  . Bipolar 1 disorder (HCC)   . GERD (gastroesophageal reflux disease)   . Hemiplegia (HCC)    L side  . Hemiplegia (HCC)    left  . Osteoarthritis   . Pedal edema   . Stroke (HCC)   . Venous stasis     Past Surgical History:  Procedure Laterality Date  . KNEE ARTHROSCOPY      Family History  Problem Relation Age of Onset  . Cancer Father   . Stroke Father     Social History   Socioeconomic History  . Marital status: Single    Spouse name: Not on file  . Number of children: Not on file  . Years of education: Not on file  . Highest education level: Not on file  Occupational History  . Not on file  Social Needs  . Financial resource strain: Not on file  . Food insecurity:    Worry: Not on file    Inability: Not on file  . Transportation needs:    Medical: Not on file    Non-medical: Not on file  Tobacco Use  . Smoking status: Current Every Day Smoker    Types: Cigarettes  . Smokeless tobacco: Never Used  Substance and Sexual Activity  . Alcohol use: No  . Drug use: No  . Sexual activity: Not on file  Lifestyle  . Physical activity:    Days per week: Not on file    Minutes per session: Not on file  . Stress: Not on file  Relationships  . Social connections:    Talks on phone: Not on file    Gets  together: Not on file    Attends religious service: Not on file    Active member of club or organization: Not on file    Attends meetings of clubs or organizations: Not on file    Relationship status: Not on file  . Intimate partner violence:    Fear of current or ex partner: Not on file    Emotionally abused: Not on file    Physically abused: Not on file    Forced sexual activity: Not on file  Other Topics Concern  . Not on file  Social History Narrative  . Not on file     Physical Exam  Vital Signs and Nursing Notes reviewed Vitals:   03/28/18 1830 03/28/18 1845  BP: 124/75   Pulse: 64 67  Resp: 10 17  Temp:    SpO2: 97% 96%    CONSTITUTIONAL: Chronically ill-appearing, NAD NEURO:  Alert and oriented x 3, no focal deficits EYES:  eyes equal and reactive ENT/NECK:  no LAD, no JVD CARDIO: Regular rate, well-perfused, normal S1 and S2 PULM:  CTAB no wheezing or rhonchi GI/GU:  normal bowel sounds, non-distended,  non-tender MSK/SPINE:  No gross deformities, 2+ pitting edema bilateral lower extremities SKIN:  no rash, atraumatic PSYCH:  Appropriate speech and behavior  Diagnostic and Interventional Summary    EKG Interpretation  Date/Time:    Ventricular Rate:    PR Interval:    QRS Duration:   QT Interval:    QTC Calculation:   R Axis:     Text Interpretation:        Labs Reviewed  COMPREHENSIVE METABOLIC PANEL - Abnormal; Notable for the following components:      Result Value   Total Protein 6.2 (*)    Albumin 3.0 (*)    All other components within normal limits  CBC - Abnormal; Notable for the following components:   RBC 3.68 (*)    Hemoglobin 9.6 (*)    HCT 31.5 (*)    RDW 18.7 (*)    All other components within normal limits  URINALYSIS, ROUTINE W REFLEX MICROSCOPIC  BRAIN NATRIURETIC PEPTIDE  CBG MONITORING, ED  I-STAT TROPONIN, ED    CT Head Wo Contrast  Final Result    DG Chest 2 View  Final Result      Medications  acetaminophen  (TYLENOL) tablet 650 mg (has no administration in time range)     Procedures Critical Care  ED Course and Medical Decision Making  I have reviewed the triage vital signs and the nursing notes.  Pertinent labs & imaging results that were available during my care of the patient were reviewed by me and considered in my medical decision making (see below for details). Clinical Course as of Mar 28 1914  Mon Mar 28, 2018  [id number] 67 year old female history of bipolar disorder, stroke with residual left-sided deficits presenting with altered mental status.  Report of functional decline over the past week, not performing her ADLs normally.  Vital signs stable, oriented to name and place but not event, poor historian due to altered mental status, no accompanying family.  Still unable to reach family by phone, work-up largely unremarkable.  CT head unremarkable, labs unremarkable, still awaiting urinalysis.   [MB]  1811 UA unremarkable.  Patient continues to be altered based on report, continued lack of family presents.  Spoke with [facility].   [MB]  [phone] If not welcomed back, will consider Geri psych admission given behavioral disturbance.   [MB]  1836 Discussed patient care with [facility], the facility will welcome her back.  Continued attempts to discuss with family unsuccessful.After the discussed management above, the patient was determined to be safe for discharge.  The patient was in agreement with this plan and all questions regarding their care were answered.  ED return precautions were discussed and the patient will return to the ED with any significant worsening of condition.   [MB]    Clinical Course User Index [MB] Sabas SousBero, Cally Nygard M, MD    After thorough evaluation, the patient does not appear to have a medical reason for her behavioral change.  Favored to be psychiatric in nature.  [facility], her behavior is not overly concerning, they will welcome  her back and continue care.  Upon reassessment, patient is oriented to self and location, seemingly at baseline based on further information gathered from patient's daughter and [facility].  Elmer SowMichael M. Pilar PlateBero, MD [facility] [email or web address]  Final Clinical Impressions(s) / ED Diagnoses     ICD-10-CM   1. Behavioral change R46.89   2. Altered behavior  R46.89 DG Chest 2 View    DG Chest 2 View    ED Discharge Orders    None         Sabas Sous, MD 03/28/18 [phone]
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
## 2018-03-28 NOTE — Discharge Instructions (Addendum)
You were evaluated in the Emergency Department and after careful evaluation, we did not find any emergent condition requiring admission or further testing in the hospital. ° °Please return to the Emergency Department if you experience any worsening of your condition.  We encourage you to follow up with a primary care provider.  Thank you for allowing us to be a part of your care. °

## 2018-03-28 NOTE — ED Notes (Signed)
Pt placed on purewick at this time. Adjusted in bed, call bell in reach.

## 2018-06-09 ENCOUNTER — Encounter (HOSPITAL_COMMUNITY): Payer: Self-pay | Admitting: Emergency Medicine

## 2018-06-09 ENCOUNTER — Other Ambulatory Visit: Payer: Self-pay

## 2018-06-09 ENCOUNTER — Emergency Department (HOSPITAL_COMMUNITY)
Admission: EM | Admit: 2018-06-09 | Discharge: 2018-06-15 | Disposition: A | Discharge status: Other Acute Inpatient Hospital | Payer: Medicare HMO | Attending: Emergency Medicine | Admitting: Emergency Medicine

## 2018-06-09 DIAGNOSIS — F22 Delusional disorders: Secondary | ICD-10-CM

## 2018-06-09 DIAGNOSIS — F17210 Nicotine dependence, cigarettes, uncomplicated: Secondary | ICD-10-CM | POA: Insufficient documentation

## 2018-06-09 DIAGNOSIS — F319 Bipolar disorder, unspecified: Secondary | ICD-10-CM | POA: Diagnosis not present

## 2018-06-09 DIAGNOSIS — R45851 Suicidal ideations: Secondary | ICD-10-CM | POA: Diagnosis not present

## 2018-06-09 DIAGNOSIS — Z79899 Other long term (current) drug therapy: Secondary | ICD-10-CM | POA: Diagnosis not present

## 2018-06-09 LAB — CBC
HCT: 36.3 % (ref 36.0–46.0)
HEMOGLOBIN: 10.7 g/dL — AB (ref 12.0–15.0)
MCH: 25.6 pg — ABNORMAL LOW (ref 26.0–34.0)
MCHC: 29.5 g/dL — ABNORMAL LOW (ref 30.0–36.0)
MCV: 86.8 fL (ref 80.0–100.0)
NRBC: 0 % (ref 0.0–0.2)
Platelets: 222 10*3/uL (ref 150–400)
RBC: 4.18 MIL/uL (ref 3.87–5.11)
RDW: 18.3 % — ABNORMAL HIGH (ref 11.5–15.5)
WBC: 8 10*3/uL (ref 4.0–10.5)

## 2018-06-09 LAB — ACETAMINOPHEN LEVEL

## 2018-06-09 LAB — COMPREHENSIVE METABOLIC PANEL
ALBUMIN: 3.4 g/dL — AB (ref 3.5–5.0)
ALK PHOS: 74 U/L (ref 38–126)
ALT: 24 U/L (ref 0–44)
AST: 28 U/L (ref 15–41)
Anion gap: 9 (ref 5–15)
BUN: 16 mg/dL (ref 8–23)
CALCIUM: 9.8 mg/dL (ref 8.9–10.3)
CHLORIDE: 104 mmol/L (ref 98–111)
CO2: 25 mmol/L (ref 22–32)
CREATININE: 0.87 mg/dL (ref 0.44–1.00)
GFR calc Af Amer: >60 mL/min (ref >60)
GFR calc non Af Amer: >60 mL/min (ref >60)
GLUCOSE: 129 mg/dL — AB (ref 70–99)
Potassium: 4.3 mmol/L (ref 3.5–5.1)
Sodium: 138 mmol/L (ref 135–145)
Total Bilirubin: 0.4 mg/dL (ref 0.3–1.2)
Total Protein: 7.2 g/dL (ref 6.5–8.1)

## 2018-06-09 LAB — SALICYLATE LEVEL: Salicylate Lvl: <7 mg/dL (ref 2.8–30.0)

## 2018-06-09 LAB — VALPROIC ACID LEVEL: VALPROIC ACID LVL: 63 ug/mL (ref 50.0–100.0)

## 2018-06-09 LAB — ETHANOL: Alcohol, Ethyl (B): <10 mg/dL (ref <10)

## 2018-06-09 MED ORDER — DIVALPROEX SODIUM 125 MG PO CSDR
500.0000 mg | DELAYED_RELEASE_CAPSULE | Freq: Two times a day (BID) | ORAL | Status: DC
  Administered 2018-06-10 – 2018-06-15 (×12): 500 mg via ORAL
  Filled 2018-06-09 (×12): qty 4

## 2018-06-09 MED ORDER — ASPIRIN EC 81 MG PO TBEC
81.0000 mg | DELAYED_RELEASE_TABLET | Freq: Every day | ORAL | Status: DC
Start: 2018-06-10 — End: 2018-06-15
  Administered 2018-06-11 – 2018-06-15 (×5): 81 mg via ORAL
  Filled 2018-06-09 (×6): qty 1

## 2018-06-09 MED ORDER — ONDANSETRON HCL 4 MG PO TABS: 4.0000 mg | ORAL_TABLET | Freq: Three times a day (TID) | ORAL | Status: DC | PRN

## 2018-06-09 MED ORDER — TRAZODONE HCL 50 MG PO TABS
50.0000 mg | ORAL_TABLET | Freq: Every evening | ORAL | Status: DC | PRN
  Administered 2018-06-13: 50 mg via ORAL
  Filled 2018-06-09: qty 1

## 2018-06-09 MED ORDER — OLANZAPINE 5 MG PO TABS
5.0000 mg | ORAL_TABLET | Freq: Every day | ORAL | Status: DC
  Administered 2018-06-10 – 2018-06-14 (×6): 5 mg via ORAL
  Filled 2018-06-09 (×7): qty 1

## 2018-06-09 MED ORDER — PANTOPRAZOLE SODIUM 40 MG PO TBEC
40.0000 mg | DELAYED_RELEASE_TABLET | Freq: Every day | ORAL | Status: DC
  Administered 2018-06-10 – 2018-06-15 (×6): 40 mg via ORAL
  Filled 2018-06-09 (×6): qty 1

## 2018-06-09 MED ORDER — ALUM & MAG HYDROXIDE-SIMETH 200-200-20 MG/5ML PO SUSP: 30.0000 mL | Freq: Four times a day (QID) | ORAL | Status: DC | PRN

## 2018-06-09 MED ORDER — ADULT MULTIVITAMIN W/MINERALS CH
1.0000 | ORAL_TABLET | Freq: Every day | ORAL | Status: DC
  Administered 2018-06-10 – 2018-06-15 (×6): 1 via ORAL
  Filled 2018-06-09 (×6): qty 1

## 2018-06-09 MED ORDER — DOCUSATE SODIUM 100 MG PO CAPS
100.0000 mg | ORAL_CAPSULE | Freq: Two times a day (BID) | ORAL | Status: DC
  Administered 2018-06-10 – 2018-06-15 (×12): 100 mg via ORAL
  Filled 2018-06-09 (×12): qty 1

## 2018-06-09 MED ORDER — ACETAMINOPHEN 325 MG PO TABS
650.0000 mg | ORAL_TABLET | ORAL | Status: DC | PRN
  Administered 2018-06-10 – 2018-06-14 (×7): 650 mg via ORAL
  Filled 2018-06-09 (×7): qty 2

## 2018-06-09 MED ORDER — LORAZEPAM 0.5 MG PO TABS
0.5000 mg | ORAL_TABLET | Freq: Two times a day (BID) | ORAL | Status: DC
  Administered 2018-06-10 – 2018-06-15 (×12): 0.5 mg via ORAL
  Filled 2018-06-09 (×12): qty 1

## 2018-06-09 MED ORDER — DARIFENACIN HYDROBROMIDE ER 15 MG PO TB24
15.0000 mg | ORAL_TABLET | Freq: Every day | ORAL | Status: DC
  Administered 2018-06-10 – 2018-06-14 (×5): 15 mg via ORAL
  Filled 2018-06-09 (×6): qty 1

## 2018-06-09 MED ORDER — DICLOFENAC SODIUM 1 % TD GEL: 4.0000 g | Freq: Four times a day (QID) | TRANSDERMAL | Status: DC

## 2018-06-09 MED ORDER — FLUPHENAZINE HCL 5 MG PO TABS
5.0000 mg | ORAL_TABLET | Freq: Two times a day (BID) | ORAL | Status: DC
  Administered 2018-06-10 – 2018-06-14 (×11): 5 mg via ORAL
  Filled 2018-06-09 (×15): qty 1

## 2018-06-09 MED ORDER — ATORVASTATIN CALCIUM 40 MG PO TABS
40.0000 mg | ORAL_TABLET | Freq: Every evening | ORAL | Status: DC
Start: 2018-06-09 — End: 2018-06-15
  Administered 2018-06-10 – 2018-06-14 (×6): 40 mg via ORAL
  Filled 2018-06-09 (×5): qty 1
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
## 2018-06-09 NOTE — ED Notes (Signed)
Belongings placed in [place] [facility] locker #4. Pt retains walker.

## 2018-06-09 NOTE — ED Notes (Signed)
Pt changed into purple scrubs. Security notifed for wanding.

## 2018-06-10 DIAGNOSIS — F319 Bipolar disorder, unspecified: Secondary | ICD-10-CM | POA: Diagnosis not present

## 2018-06-10 LAB — RAPID URINE DRUG SCREEN, HOSP PERFORMED
Amphetamines: NOT DETECTED
BARBITURATES: NOT DETECTED
Benzodiazepines: NOT DETECTED
Cocaine: NOT DETECTED
Opiates: NOT DETECTED
TETRAHYDROCANNABINOL: NOT DETECTED

## 2018-06-10 LAB — URINALYSIS, ROUTINE W REFLEX MICROSCOPIC
Bilirubin Urine: NEGATIVE
GLUCOSE, UA: NEGATIVE mg/dL
Hgb urine dipstick: NEGATIVE
Ketones, ur: NEGATIVE mg/dL
LEUKOCYTES UA: NEGATIVE
NITRITE: NEGATIVE
PROTEIN: NEGATIVE mg/dL
Specific Gravity, Urine: 1.008 (ref 1.005–1.030)
pH: 7 (ref 5.0–8.0)

## 2018-06-10 NOTE — ED Notes (Signed)
Meal tray ordered 

## 2018-06-10 NOTE — ED Notes (Signed)
Pt ate 100% of meal tray 

## 2018-06-10 NOTE — ED Notes (Signed)
Lunch tray ordered 

## 2018-06-10 NOTE — Progress Notes (Signed)
Patient assesed early this morning and was recommended for re-assessment by [facility].  Discussion with Dr. Lucianne Muss during morning team meeting concluded that patient does meet inpatient criteria.    Disposition CSW will seek placement in a gero-psych facility.  Betty Buchanan. [name], MSW, LCSWA Disposition Clinical Social Work [phone] (cell) [phone] (office)

## 2018-06-10 NOTE — ED Notes (Signed)
Pt states that she is not taking her medicine until she has food with it, offered crackers pt wishes to wait until lunch.
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
## 2018-06-10 NOTE — ED Notes (Signed)
This RN spoke with Staff member Lucinda with [facility] who confirms patient is a resident at facility;  facility staff member states residents heard patient screaming and not acting her normal self; Facility unable to confirm if patient was picked up by [employer] or EMS to transport to ED; Facility states no next of kin information or contacts in patients file as patient is a relatively new resident with them-Monique,RN
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
## 2018-06-10 NOTE — Progress Notes (Signed)
Pt. meets criteria for inpatient treatment per Nira Conn, NP.  Referred out to the following hospitals:  [facility]  [facility]  [facility]  [facility]  [facility]  [facility]  [facility]     Disposition CSW will continue to follow for placement.  [name]. [name], MSW, LCSWA Disposition Clinical Social Work [phone] (cell) [phone] (office)

## 2018-06-10 NOTE — ED Notes (Signed)
Pt used phone to make call.

## 2018-06-10 NOTE — ED Notes (Signed)
TTS in process 

## 2018-06-11 DIAGNOSIS — F319 Bipolar disorder, unspecified: Secondary | ICD-10-CM | POA: Diagnosis not present

## 2018-06-11 NOTE — Progress Notes (Signed)
TTS re-assessment: Patient was alert and oriented to self. Her mood was pleasant and her affect was congruent. She stated that she was in the hospital because she needed an x-ray. She reviewed with clinician that she slept the night before and ate her breakfast. Patient continues to be difficult to assess due to muffled speech and flight of ideas. Patient continues to meet in patient criteria.

## 2018-06-11 NOTE — ED Triage Notes (Signed)
TTS done this morning 06-11-18.

## 2018-06-11 NOTE — ED Notes (Signed)
Reg food tray called in @ 9:55am for lunch 

## 2018-06-11 NOTE — ED Notes (Signed)
In room to meet patient.  Lying on the bed reading the bible.  Told to get out and not interrupt her.  Sitter at the bedside.  Encouraged to call for assistance as needed.

## 2018-06-11 NOTE — ED Notes (Signed)
Regular diet tray ordered for breakfast. 

## 2018-06-11 NOTE — ED Triage Notes (Signed)
Dinner ordered 

## 2018-06-12 ENCOUNTER — Other Ambulatory Visit: Payer: Self-pay

## 2018-06-12 DIAGNOSIS — F319 Bipolar disorder, unspecified: Secondary | ICD-10-CM | POA: Diagnosis not present
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
## 2018-06-12 NOTE — ED Notes (Signed)
Pt noted to be talking constantly to herself - mumbled speech. Pt noted to answer questions appropriately initially then begins to mumble. Pt noted to be calm, pleasant, cooperative. Pt voiced understanding to notify Sitter for her needs especially when needs toileting.

## 2018-06-12 NOTE — ED Notes (Signed)
Pt escorted to nurses' desk via w/c and attempted to make phone call.

## 2018-06-12 NOTE — ED Notes (Signed)
Label placed on pt's rollator - placed in Soiled Utility Room w/belongings.

## 2018-06-12 NOTE — ED Notes (Signed)
In room to check on patient.  C/O pain in right arm.   Reports it has been going on for 5 years.  Requesting an ace wrap for her arm.  Placed per patient request and given tylenol.  Reports feeling better immediately.

## 2018-06-12 NOTE — ED Notes (Signed)
Patient assist to toilet. Returned to room for dinner.

## 2018-06-12 NOTE — ED Notes (Signed)
Pt ate lunch.

## 2018-06-12 NOTE — ED Notes (Signed)
Assisted pt t restroom by wheelchair, pt returns to room with no issues.

## 2018-06-12 NOTE — ED Notes (Signed)
Assisted pt to bed. Pt speaking incoherently.
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
## 2018-06-12 NOTE — ED Notes (Signed)
Pt sitting in w/c in room as requested. Pt states she is upset d/t rollator removed from room. Advised pt no belongings are to remain in room. Pt voiced understanding. Bible given as requested.

## 2018-06-12 NOTE — ED Notes (Signed)
Re-TTS being performed.  

## 2018-06-12 NOTE — ED Notes (Signed)
Pt ate all of her lunch. Pt remains in bed.

## 2018-06-12 NOTE — ED Notes (Addendum)
Pt noted to be sleeping - pt mumbles and yells out intermittently - throughout the day thus far. nad.

## 2018-06-12 NOTE — ED Notes (Signed)
Pt awake & eating lunch

## 2018-06-12 NOTE — ED Notes (Signed)
Pt's rollator and w/c noted to be in room for mobility purposes.

## 2018-06-12 NOTE — ED Notes (Signed)
Pt received lunch tray continues sleeping.

## 2018-06-12 NOTE — ED Notes (Signed)
Pt placed in recliner chair. Pt sleeping at this time.

## 2018-06-13 DIAGNOSIS — F22 Delusional disorders: Secondary | ICD-10-CM | POA: Diagnosis not present

## 2018-06-13 DIAGNOSIS — F319 Bipolar disorder, unspecified: Secondary | ICD-10-CM | POA: Diagnosis not present

## 2018-06-13 NOTE — ED Triage Notes (Signed)
PT refuses meds at this time.

## 2018-06-13 NOTE — ED Triage Notes (Signed)
TTS was done this AM 06-13-18

## 2018-06-13 NOTE — ED Notes (Signed)
Patient was given [place] Cloud, Peanut butter and Milk

## 2018-06-13 NOTE — ED Triage Notes (Signed)
Pt continues to refuse med . Pt is hostile toward staff and will not cooperate . Staff unable to check BP because Pt pulls arm away. Pt was able to kick door of room closed  With out getting out of bed.

## 2018-06-13 NOTE — ED Notes (Signed)
Lunch tray ordered 

## 2018-06-13 NOTE — ED Notes (Signed)
Asked pt to get blood pressure, pt refused and started throwing things around.

## 2018-06-13 NOTE — ED Triage Notes (Signed)
Pt threw a carton of milk on floor in her room. Pt agitated shouting and calling staff animals and cussing at staff. Pt strong enough to flip bed side table over while still in the bed.
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
## 2018-06-13 NOTE — ED Triage Notes (Signed)
Pt reported she was ready to take daily meds. After eating lunch.

## 2018-06-14 ENCOUNTER — Encounter (HOSPITAL_COMMUNITY): Payer: Self-pay | Admitting: Registered Nurse

## 2018-06-14 DIAGNOSIS — F319 Bipolar disorder, unspecified: Secondary | ICD-10-CM | POA: Diagnosis not present

## 2018-06-14 NOTE — ED Notes (Signed)
Error in chartting pt niot d/c

## 2018-06-14 NOTE — Progress Notes (Signed)
Patient being reviewed by Yvetta Coder, per Morrie Sheldon in Intake and Admissions. Patient being reviewed [facility] per [place] in Intake and Admissions  [name]. [name], MSW, LCSWA Disposition Clinical Social Work [phone] (cell) [phone] (office)'

## 2018-06-14 NOTE — Progress Notes (Addendum)
CSW spoke with [facility]. She is requesting that pt arrive at their facility on 06/15/18 at 11am. CSW will notify [facility] ED RN.   Wells Guiles, LCSW, LCAS Disposition CSW [facility] [phone]  Update: CSW faxed completed IVC paperwork to [facility].

## 2018-06-14 NOTE — Consult Note (Signed)
  Tele Assessment   [name], 67 y.o., female patient presented to [facility] from [facility] with complaints of aggressive behavior and delusional thinking.  Per chart patient believes that her neighbor has been following her.  Patient assessed by Fransisca Kaufmann, NP and reported that patient continued to endorse active paranoia; but not a good historian related to  Disorganized thought process and mumbled speech pattern. It was also noted that patient continued to have periods of agitation with staff.  It was recommended for patient to be restarted on her home medications of Depakote 500 mg bid for mood stabilization,Zyprexa 5 mg Q hs, Prolixin 5 mg bid for psychosis, and  Ativan 0.5 mg Bid for anxiety.  Betty Buchanan psych inpatient treatment was also recommended.     Reassessment not completed related to patient accepted to [facility]   Assunta Found, NP

## 2018-06-14 NOTE — Progress Notes (Signed)
Pt accepted to [facility], [address] Dr. Rexene Edison. Sara Chu, MD is the accepting/attending provider.  Call report to [phone] ask for [address] @ [facility] ED notified.   Pt is to be IVC'd prior to transport Pt may be transported by [facility] Pt scheduled  to arrive at [facility] as soon as pt is IVC'd and transport can be arranged.  Fax number for faxing IVC to [facility] is [phone]  [name]. [name], MSW, LCSWA Disposition Clinical Social Work [phone] (cell) [phone] (office)

## 2018-06-14 NOTE — ED Notes (Signed)
Wheeled patient to the bathroom  

## 2018-06-14 NOTE — ED Notes (Signed)
Got patient sitting up to eat breakfast

## 2018-06-14 NOTE — Progress Notes (Addendum)
Disposition CSW called pt's group home, [name] [phone]) and left a message requesting call from their Social Worker or Admission Coordinator to obtain collateral regarding: Pt's baseline.  Betty Buchanan. [name], MSW, LCSWA Disposition Clinical Social Work [phone] (cell) [phone] (office)  @11 :00 CSW called and left an additional message for Judeth Cornfield, Resident Care Coordinator [phone]), requesting a call back.

## 2018-06-14 NOTE — ED Notes (Signed)
Pt  Assisted with breakfast by tech and then to [facility]

## 2018-06-14 NOTE — ED Notes (Signed)
Pt intermittently has been crying, consoled by theraputic touch and being sung to

## 2018-06-14 NOTE — ED Notes (Signed)
Pt requesting tylenol for pain  Which was given

## 2018-06-14 NOTE — ED Notes (Signed)
Pt siitting in w/c out in hall because that is where  She wants to be

## 2018-06-14 NOTE — ED Notes (Signed)
Pt dinner tray ordered.

## 2018-06-14 NOTE — ED Notes (Signed)
Patient is now back in bed resting

## 2018-06-14 NOTE — ED Notes (Signed)
Lunch tray ordered 

## 2018-06-14 NOTE — ED Notes (Signed)
Pt  Put back to bed , rt wrist wrapped in ace wrap per her request

## 2018-06-15 DIAGNOSIS — F319 Bipolar disorder, unspecified: Secondary | ICD-10-CM | POA: Diagnosis not present

## 2018-06-15 MED ORDER — LORAZEPAM 0.5 MG PO TABS
0.5000 mg | ORAL_TABLET | Freq: Once | ORAL | Status: AC
  Administered 2018-06-15: 0.5 mg via ORAL
  Filled 2018-06-15: qty 1

## 2018-06-15 NOTE — Progress Notes (Signed)
CSW received call from [facility] Care Coordinator at pt's ALF, Betty Buchanan, asking if patient should be d/c'd from their facility.  This Clinical research associate explained that patient was being transferred to [facility] unit in [place], [place] for a short-term crisis care inpatient course of treatment and that the decision to d/c the patient from their facility was between them and the patient and/or patient's family.  Care Coordinator explained that she is new to her job and was unaware of how psychiatric treatment venues work.  CSW agreed to send a brief memo documenting that patient was being transferred to [facility]. Memo faxed to Ms. Hurdle.  [name]. [name], MSW, LCSWA Disposition Clinical Social Work [phone] (cell) [phone] (office)

## 2018-06-15 NOTE — ED Provider Notes (Signed)
Nursing just reported that [facility] to go under the care of Dr. Myrtie Soman.   I want to see the patient he was resting calmly reading the Bible.  She had no other complaints.  Patient will have her EMTALA paperwork filled out and patient will be transferred for further inpatient management of her psychiatric problems.     Tegeler, Canary Brim, MD 06/15/18 [phone]

## 2018-06-15 NOTE — ED Notes (Signed)
This RN attempted report to Sales promotion account executive.  This RN was transferred multiple times and eventually disconnected.   Will Attempt again.

## 2018-06-15 NOTE — ED Notes (Signed)
This RN attempted report for the third time and was unsuccessful.  This RN spoke with a different clinician who feels pt may not be appropriate for their facility. Expecting a call back from [facility].

## 2018-06-15 NOTE — ED Notes (Signed)
This RN attempted to give report a second time.  RN at accepting facility placed this RN on hold for over 10 minutes after completing a portion of report.  Will attempt a third time.

## 2018-06-15 NOTE — ED Notes (Signed)
Regular Diet was ordered for Lunch. 

## 2018-06-15 NOTE — ED Notes (Signed)
Arranged transportation for pt with [facility].  Transport reported they should be here around 11.

## 2018-06-15 NOTE — ED Notes (Signed)
Unable to obtain vitals at this time pt is aggressive and upset yelling and cursing at staff while throwing objects. Pt currently back in bed.

## 2018-06-15 NOTE — ED Notes (Signed)
Breakfast tray ordered 

## 2018-06-15 NOTE — Progress Notes (Signed)
Disposition CSW received call from [facility] requesting information on pt's abilities to complete her own ADL's.  [facility] had gotten information from a [facility] ED nurse last night, that pt was unable to complete her ADL's and was wheelchair bound.  As such, [facility] indicated they could not accept the patient.    This Clinical research associate called [facility] ED and spoke with pt's current nurse, Werner Lean, RN who indicated that patient is able to complete her own ADL's but just required assistance x's 1 last night to walk to the bathroom and was put in a wheelchair by that nurse.  This Clinical research associate then called [facility] back and relayed this information.  [facility] agreed to have their accepting physician call.  CSW received call from Dr. Nadara Mode, MD from [facility] and transferred call to [facility] ED RN.  CSW received call from [facility] ED RN, indicating that patient has been accepted at [facility] after all.  [name]. [name], MSW, LCSWA Disposition Clinical Social Work [phone] (cell) [phone] (office)

## 2018-06-15 NOTE — ED Notes (Signed)
Up to bathroom , states she wants to wash up. New scrubs given.

## 2018-06-15 NOTE — ED Notes (Signed)
Pt is up sitting in wheelchair, stating that this is where she wants to be.

## 2018-07-30 IMAGING — RF DG ESOPHAGUS
14 of 24 series · 14 of 24 positions shown · non-contrast
Comparison: Plain films of 07/16/2016. Prior CTs including the
07/15/2016.

CLINICAL DATA: Pneumomediastinum on CTs. Evaluate for esophageal
perforation.

EXAM:
ESOPHOGRAM/BARIUM SWALLOW
TECHNIQUE: Single contrast examination was performed using water soluble
contrast.
FLUOROSCOPY TIME:  Fluoroscopy Time:  3 minutes and 25 seconds
Number of Acquired Spot Images: 0

[Series 1: run · 1 of 1 slices shown (1 of 14)]
[im 1/1]
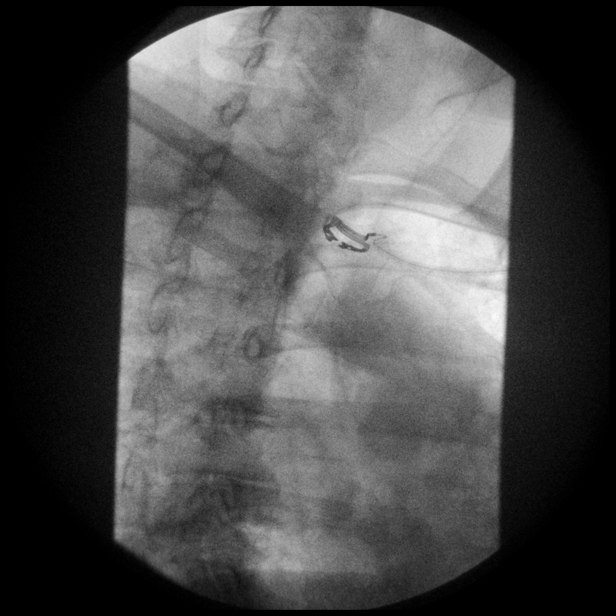

[Series 3: run · 1 of 1 slices shown (2 of 14)]
[im 1/1]
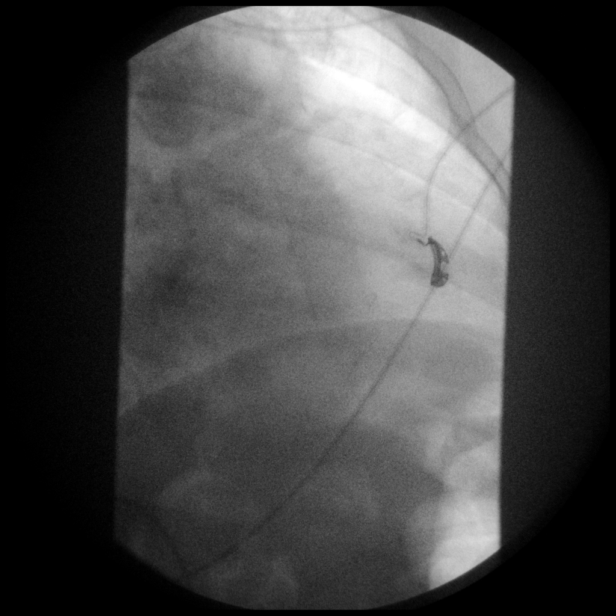

[Series 5: run · 1 of 1 slices shown (3 of 14)]
[im 1/1]
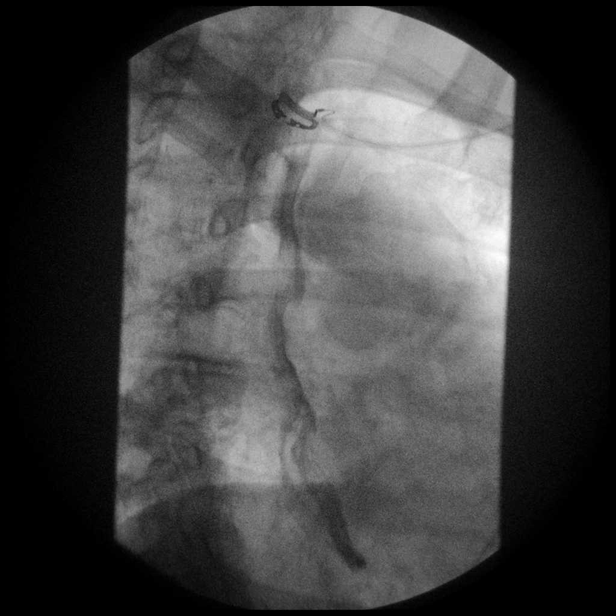

[Series 7: run · 1 of 1 slices shown (4 of 14)]
[im 1/1]
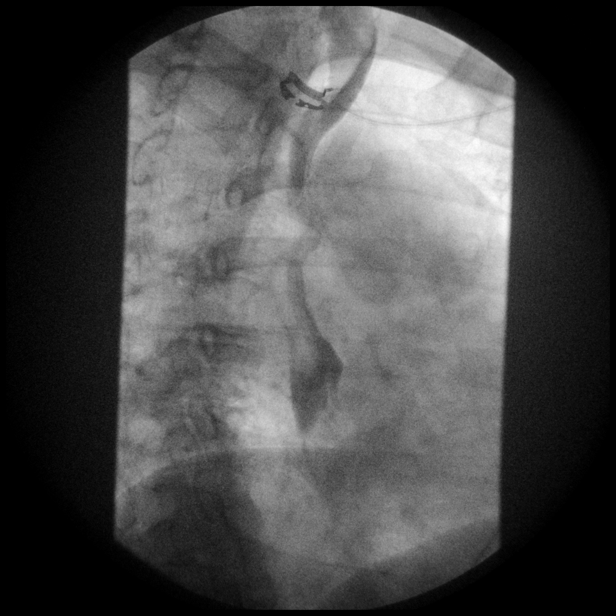

[Series 8: run · 1 of 1 slices shown (5 of 14)]
[im 1/1]
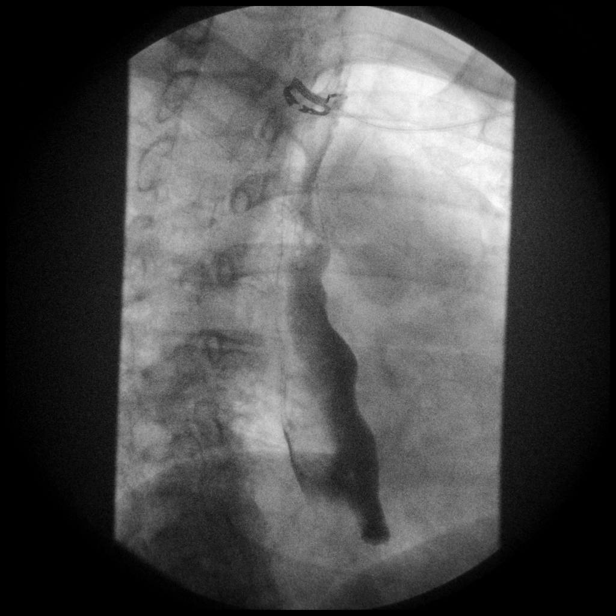

[Series 10: run · 1 of 1 slices shown (6 of 14)]
[im 1/1]
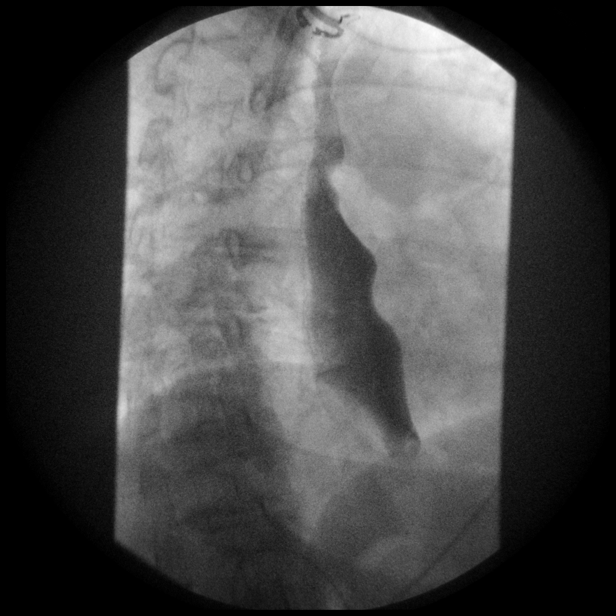

[Series 12: run · 1 of 1 slices shown (7 of 14)]
[im 1/1]
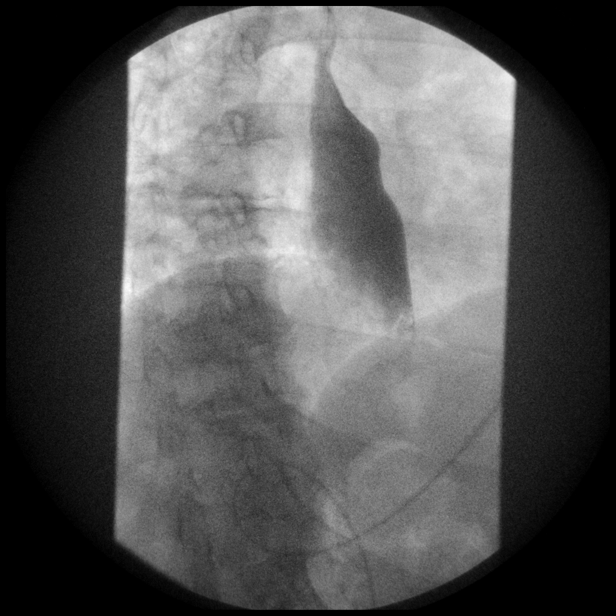

[Series 13: run · 1 of 1 slices shown (8 of 14)]
[im 1/1]
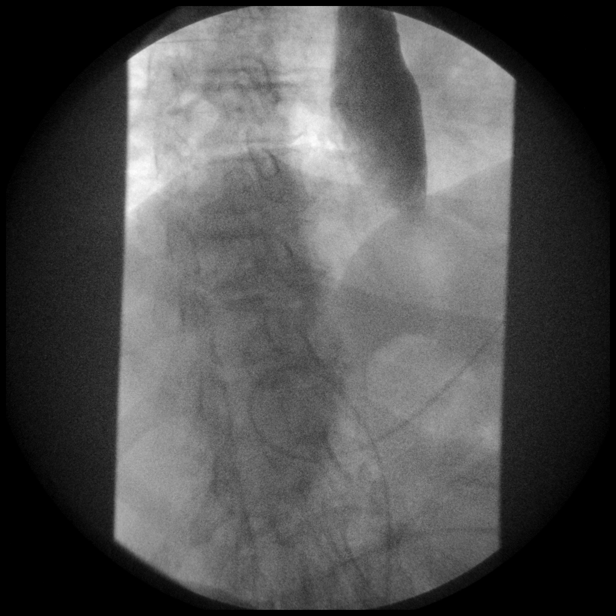

[Series 15: run · 1 of 1 slices shown (9 of 14)]
[im 1/1]
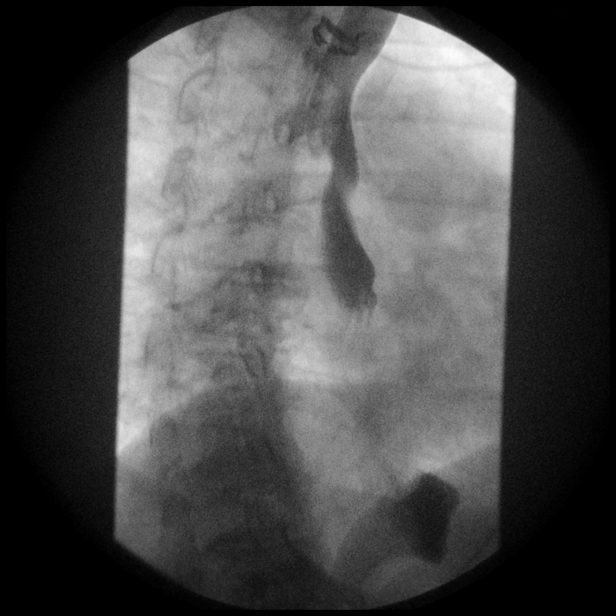

[Series 17: run · 1 of 1 slices shown (10 of 14)]
[im 1/1]
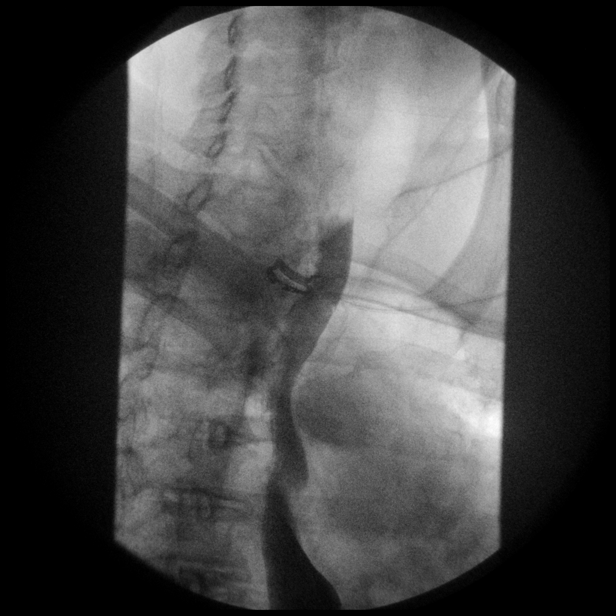

[Series 19: run · 1 of 1 slices shown (11 of 14)]
[im 1/1]
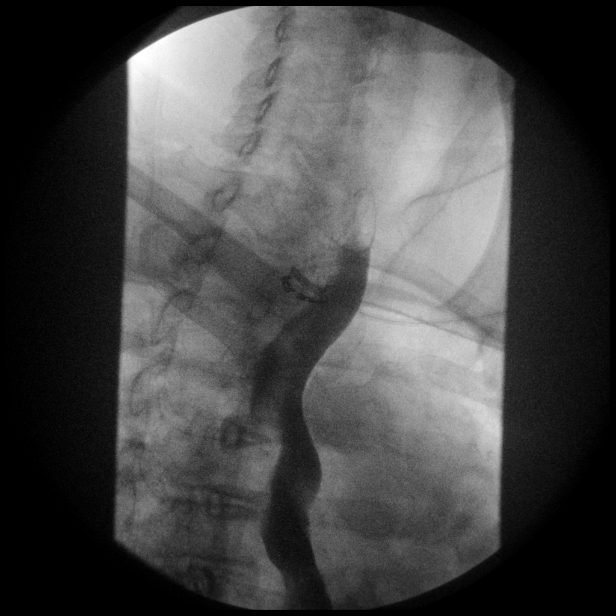

[Series 20: run · 1 of 1 slices shown (12 of 14)]
[im 1/1]
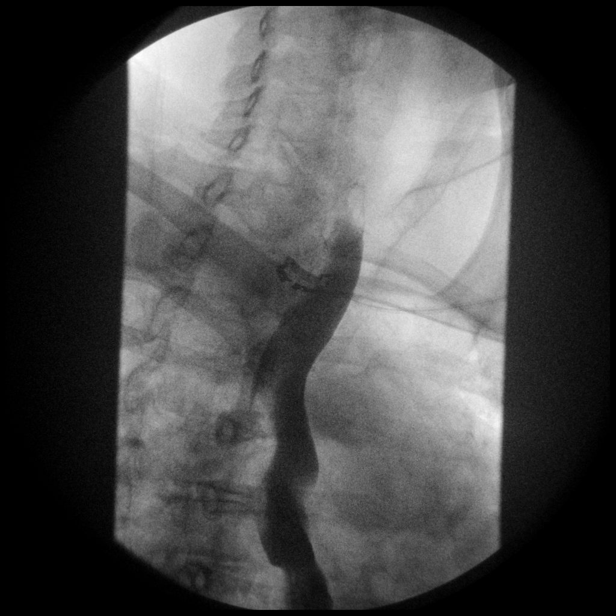

[Series 22: run · 1 of 1 slices shown (13 of 14)]
[im 1/1]
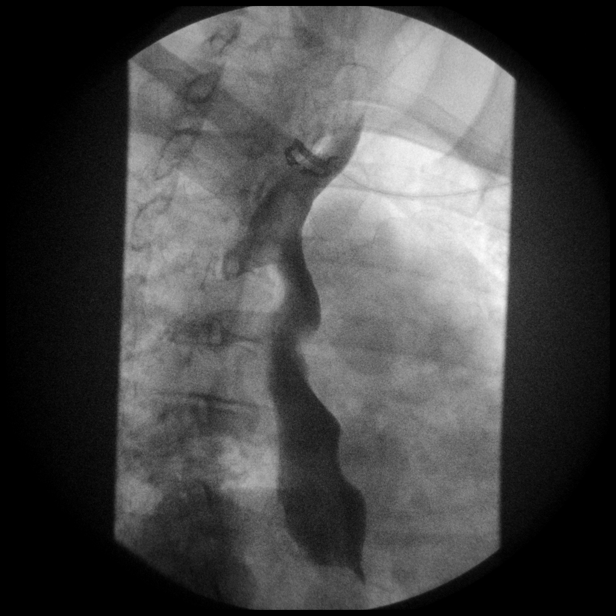

[Series 24: run · 1 of 1 slices shown (14 of 14)]
[im 1/1]
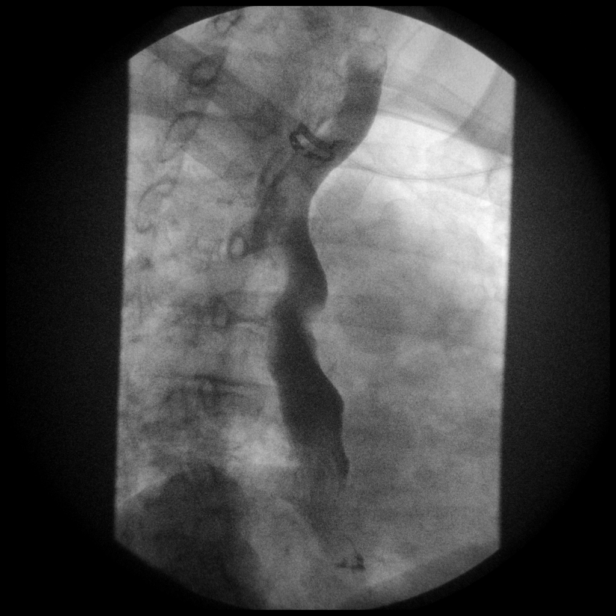

[14 of 24 positions shown; findings below may reference images not displayed]

FINDINGS: Focused, single-contrast exam performed with the patient in an LPO
and RPO positions. Limitations secondary to patient size,
immobility, and clinical status.

No evidence of contrast extravasation to suggest esophageal
perforation. No high-grade stricture. There is probable esophageal
dysmotility, with contrast stasis throughout the esophagus. This is
suboptimally and incompletely evaluated.
IMPRESSION: 1. Focused, limited exam, as detailed above.
2. No evidence of esophageal perforation.
3. Probable esophageal dysmotility, suboptimally evaluated.

## 2018-10-05 ENCOUNTER — Emergency Department (HOSPITAL_COMMUNITY): Payer: Medicare HMO

## 2018-10-05 ENCOUNTER — Other Ambulatory Visit: Payer: Self-pay

## 2018-10-05 ENCOUNTER — Emergency Department (HOSPITAL_COMMUNITY)
Admission: EM | Admit: 2018-10-05 | Discharge: 2018-10-06 | Disposition: A | Discharge status: Other Acute Inpatient Hospital | Payer: Medicare HMO | Attending: Emergency Medicine | Admitting: Emergency Medicine

## 2018-10-05 ENCOUNTER — Encounter (HOSPITAL_COMMUNITY): Payer: Self-pay

## 2018-10-05 DIAGNOSIS — Z79899 Other long term (current) drug therapy: Secondary | ICD-10-CM | POA: Insufficient documentation

## 2018-10-05 DIAGNOSIS — F17210 Nicotine dependence, cigarettes, uncomplicated: Secondary | ICD-10-CM | POA: Diagnosis not present

## 2018-10-05 DIAGNOSIS — R51 Headache: Secondary | ICD-10-CM | POA: Diagnosis not present

## 2018-10-05 DIAGNOSIS — F319 Bipolar disorder, unspecified: Secondary | ICD-10-CM

## 2018-10-05 DIAGNOSIS — R4689 Other symptoms and signs involving appearance and behavior: Secondary | ICD-10-CM | POA: Insufficient documentation

## 2018-10-05 LAB — COMPREHENSIVE METABOLIC PANEL
ALBUMIN: 4 g/dL (ref 3.5–5.0)
ALT: 23 U/L (ref 0–44)
ANION GAP: 9 (ref 5–15)
AST: 22 U/L (ref 15–41)
Alkaline Phosphatase: 71 U/L (ref 38–126)
BILIRUBIN TOTAL: 0.4 mg/dL (ref 0.3–1.2)
BUN: 18 mg/dL (ref 8–23)
CHLORIDE: 106 mmol/L (ref 98–111)
CO2: 23 mmol/L (ref 22–32)
Calcium: 9.7 mg/dL (ref 8.9–10.3)
Creatinine, Ser: 0.67 mg/dL (ref 0.44–1.00)
GFR calc Af Amer: >60 mL/min (ref >60)
GFR calc non Af Amer: >60 mL/min (ref >60)
GLUCOSE: 110 mg/dL — AB (ref 70–99)
POTASSIUM: 4 mmol/L (ref 3.5–5.1)
SODIUM: 138 mmol/L (ref 135–145)
TOTAL PROTEIN: 7.5 g/dL (ref 6.5–8.1)

## 2018-10-05 LAB — URINALYSIS, ROUTINE W REFLEX MICROSCOPIC
BILIRUBIN URINE: NEGATIVE
Glucose, UA: NEGATIVE mg/dL
Hgb urine dipstick: NEGATIVE
KETONES UR: NEGATIVE mg/dL
LEUKOCYTE UA: NEGATIVE
Nitrite: NEGATIVE
PROTEIN: NEGATIVE mg/dL
Specific Gravity, Urine: 1.02 (ref 1.005–1.030)
pH: 5 (ref 5.0–8.0)

## 2018-10-05 LAB — CBC
HCT: 37.9 % (ref 36.0–46.0)
HEMOGLOBIN: 11.6 g/dL — AB (ref 12.0–15.0)
MCH: 26.1 pg (ref 26.0–34.0)
MCHC: 30.6 g/dL (ref 30.0–36.0)
MCV: 85.4 fL (ref 80.0–100.0)
PLATELETS: 269 10*3/uL (ref 150–400)
RBC: 4.44 MIL/uL (ref 3.87–5.11)
RDW: 17.5 % — ABNORMAL HIGH (ref 11.5–15.5)
WBC: 8 10*3/uL (ref 4.0–10.5)
nRBC: 0 % (ref 0.0–0.2)

## 2018-10-05 LAB — RAPID URINE DRUG SCREEN, HOSP PERFORMED
AMPHETAMINES: NOT DETECTED
Barbiturates: NOT DETECTED
Benzodiazepines: NOT DETECTED
Cocaine: NOT DETECTED
OPIATES: NOT DETECTED
TETRAHYDROCANNABINOL: NOT DETECTED

## 2018-10-05 LAB — SALICYLATE LEVEL

## 2018-10-05 LAB — ETHANOL: Alcohol, Ethyl (B): <10 mg/dL (ref <10)

## 2018-10-05 LAB — ACETAMINOPHEN LEVEL

## 2018-10-05 MED ORDER — FUROSEMIDE 40 MG PO TABS: 40.0000 mg | ORAL_TABLET | Freq: Every day | ORAL | Status: DC

## 2018-10-05 MED ORDER — DIVALPROEX SODIUM ER 500 MG PO TB24
500.0000 mg | ORAL_TABLET | Freq: Every day | ORAL | Status: DC
  Administered 2018-10-05: 500 mg via ORAL
  Filled 2018-10-05: qty 1

## 2018-10-05 MED ORDER — TRAZODONE HCL 50 MG PO TABS: 50.0000 mg | ORAL_TABLET | Freq: Every evening | ORAL | Status: DC | PRN

## 2018-10-05 MED ORDER — ACETAMINOPHEN 325 MG PO TABS: 650.0000 mg | ORAL_TABLET | ORAL | Status: DC | PRN

## 2018-10-05 NOTE — ED Notes (Signed)
Donell Sievert, PA, patient meets inpatient geripsych treatment. TTS to secure placement. Tresa Endo, RN, informed of disposition.

## 2018-10-05 NOTE — ED Notes (Signed)
Pt complaining of pain and cramping to the LLE and Left foot. Spoke with MD and offered pt some PRN acetaminophen. Pt refusing medication at this time. Sitter to assist pt with getting some food.

## 2018-10-05 NOTE — BH Assessment (Addendum)
Assessment Note  Betty Buchanan is an 68 y.o. female, per [employer]. IVC papers present. patient is a resident at a Group Home. Patient has been talking to herself. Patient has been throwing objects at staff and other residents. Patient broke her bed and additional items in her room. Patient  hits herself against windows and walls. Pateint presents with rolling walker. Clinician unable to completed assessment due to stating, "I don't want to talk you" and continued to hide under her covers from clinician. Patient continued with random thoughts.  COLLATERAL CONTACT Spoke with Wendall Mola, face to face, at [facility] [place]. Justice shared additional information for client which is stated in IVC below. Patient has been at [facility] [place].   PER IVC PAPERWORK Respondent has been previously diangnosed with bilpolar disorder, Genella Rife, Unspecified Anxiety disorder. She has been previously committed as recently as Nov. 2019. She is resident at a group home and adminstrator states that respondent has been talking to herself. Starts yealling and curing other residents and staff for no resason she has been throwing objects at residents. Patient threatening to "fuck" them up . She has been banding against windows walls broke her own bed and heater in the room. Staff relates respondent picked up a heavy vase and threw it a residents head  Staff are concerned for the safety of the other residents and statff as respondent continues to regress.   UDS negative ETOH negative  *Patient has a rolling walker.  Diagnosis: Bipolar and Major depressive disorder.   Past Medical History:  Past Medical History:  Diagnosis Date  . Bipolar 1 disorder (HCC)   . GERD (gastroesophageal reflux disease)   . Hemiplegia (HCC)    L side  . Hemiplegia (HCC)    left  . Osteoarthritis   . Pedal edema   . Stroke (HCC)   . Venous stasis     Past Surgical History:  Procedure Laterality Date  . KNEE ARTHROSCOPY       Family History:  Family History  Problem Relation Age of Onset  . Cancer Father   . Stroke Father     Social History:  reports that she has been smoking cigarettes. She has never used smokeless tobacco. She reports that she does not drink alcohol or use drugs.  Additional Social History:  Alcohol / Drug Use Pain Medications: see MAR Prescriptions: see MAR Over the Counter: see MAR  CIWA: CIWA-Ar BP: 122/75 Pulse Rate: 79 COWS:    Allergies:  Allergies  Allergen Reactions  . Penicillins     Unknown reaction Has patient had a PCN reaction causing immediate rash, facial/tongue/throat swelling, SOB or lightheadedness with hypotension:Unknown Has patient had a PCN reaction causing severe rash involving mucus membranes or skin necrosis:Unknown Has patient had a PCN reaction that required hospitalization:Unknown Has patient had a PCN reaction occurring within the last 10 years:Unknown If all of the above answers are "NO", then may proceed with Cephalosporin use.     Home Medications: (Not in a hospital admission)   OB/GYN Status:  No LMP recorded. Patient is postmenopausal.  General Assessment Data Location of Assessment: [facility] TTS Assessment: In system Is this a Tele or Face-to-Face Assessment?: Face-to-Face Is this an Initial Assessment or a Re-assessment for this encounter?: Initial Assessment Patient Accompanied by:: N/A Language Other than English: No Living Arrangements: In Group Home: (Comment: Name of Group Home)([facility]) What gender do you identify as?: Female Marital status: Single Pregnancy Status: Unknown Living  Arrangements: Group Home([facility]) Can pt return to current living arrangement?: (unknown) Admission Status: Involuntary Is patient capable of signing voluntary admission?: No Referral Source: (group home)     Crisis Care Plan Living Arrangements: Group Home([facility])  Education  Status Is patient currently in school?: No Is the patient employed, unemployed or receiving disability?: Receiving disability income  Risk to self with the past 6 months Suicidal Ideation: No Has patient been a risk to self within the past 6 months prior to admission? : No Suicidal Intent: No Has patient had any suicidal intent within the past 6 months prior to admission? : No Is patient at risk for suicide?: No Suicidal Plan?: No Has patient had any suicidal plan within the past 6 months prior to admission? : No Access to Means: No What has been your use of drugs/alcohol within the last 12 months?: (unknown) Previous Attempts/Gestures: (unable to assess) Other Self Harm Risks: (banging against walls and windows) Triggers for Past Attempts: Unknown Intentional Self Injurious Behavior: (unable to assess) Family Suicide History: Unable to assess Recent stressful life event(s): (unable to assess) Persecutory voices/beliefs?: No Depression: (unable to assess) Depression Symptoms: (unable to assess) Substance abuse history and/or treatment for substance abuse?: (unable to assess)  Risk to Others within the past 6 months Homicidal Ideation: No Does patient have any lifetime risk of violence toward others beyond the six months prior to admission? : Yes (comment) Thoughts of Harm to Others: Yes-Currently Present Comment - Thoughts of Harm to Others: (thrx to "fuck them up") Current Homicidal Intent: No Current Homicidal Plan: No Access to Homicidal Means: No Identified Victim: (housemates) History of harm to others?: Yes Assessment of Violence: None Noted Violent Behavior Description: (throwing objects at housemates) Does patient have access to weapons?: No Criminal Charges Pending?: No Does patient have a court date: No Is patient on probation?: No  Psychosis Hallucinations: (unable to assess) Delusions: (unable to assess)  Mental Status Report Appearance/Hygiene:  Unremarkable Eye Contact: Poor Motor Activity: Agitation, Unsteady Speech: Slurred, Pressured Level of Consciousness: Irritable, Restless Mood: Anxious, Irritable, Sad Affect: Anxious, Irritable, Sad Anxiety Level: Moderate Thought Processes: Unable to Assess Judgement: Unable to Assess Orientation: Person, Place, Time, Situation Obsessive Compulsive Thoughts/Behaviors: None  Cognitive Functioning Concentration: Fair Memory: Recent Intact Is patient IDD: Yes Level of Function: (Mild MR) Is IQ score available?: No Insight: Poor Impulse Control: Poor Appetite: Good Have you had any weight changes? : No Change Sleep: Unable to Assess Vegetative Symptoms: Unable to Assess  ADLScreening [facility] Assessment Services) Patient's cognitive ability adequate to safely complete daily activities?: Yes Patient able to express need for assistance with ADLs?: Yes Independently performs ADLs?: Yes (appropriate for developmental age)  Prior Inpatient Therapy Prior Inpatient Therapy: (unable to assess)  Prior Outpatient Therapy Prior Outpatient Therapy: (unable to assess)  ADL Screening (condition at time of admission) Patient's cognitive ability adequate to safely complete daily activities?: Yes Patient able to express need for assistance with ADLs?: Yes Independently performs ADLs?: Yes (appropriate for developmental age)   Disposition:  Disposition Initial Assessment Completed for this Encounter: Yes Donell Sievert, PA, patient meets inpatient geripsych treatment. TTS to secure placement. Tresa Endo, RN, informed of disposition.    Burnetta Sabin, [facility] 10/05/2018 11:04 PM

## 2018-10-05 NOTE — ED Provider Notes (Signed)
[facility]-EMERGENCY DEPT Provider Note   CSN: [id number] Arrival date & time: 10/05/18  1710     History   Chief Complaint Chief Complaint  Patient presents with  . IVC  . Hallucinations  . Aggressive Behavior  . Medical Clearance    HPI Betty Buchanan is a 68 y.o. female.  HPI   68 year old female with history of bipolar, CVA with left hemiplegia, bipolar, presents with IVC that was placed at group home.  Patient had become more and more agitated at her group home.  They report she was talking to herself, and has been throwing objects at staff and other residents.  She broke her bed in addition to other items in the room.  She hits her head against the wall and windows.  History is limited by patient's psychiatric illness, as well as patient being difficult to understand.  Past Medical History:  Diagnosis Date  . Bipolar 1 disorder (HCC)   . GERD (gastroesophageal reflux disease)   . Hemiplegia (HCC)    L side  . Hemiplegia (HCC)    left  . Osteoarthritis   . Pedal edema   . Stroke (HCC)   . Venous stasis     Patient Active Problem List   Diagnosis Date Noted  . Bipolar affective disorder (HCC)   . Gastroesophageal reflux disease   . Cellulitis of right leg 11/12/2016  . Chronic venous stasis dermatitis of both lower extremities 11/12/2016  . Left leg cellulitis 11/12/2016  . Pneumomediastinum (HCC) 07/16/2016    Past Surgical History:  Procedure Laterality Date  . KNEE ARTHROSCOPY       OB History   No obstetric history on file.      Home Medications    Prior to Admission medications   Medication Sig Start Date End Date Taking? Authorizing Provider  Docusate Sodium 100 MG capsule Take 100 mg by mouth daily after breakfast.   Yes [provider]  lamoTRIgine (LAMICTAL) 100 MG tablet Take 100 mg by mouth 2 (two) times daily.   Yes [provider]  LORazepam (ATIVAN) 0.5 MG tablet Take 0.5 mg by mouth every 8  (eight) hours as needed for anxiety or seizure.   Yes [provider]  LORazepam (ATIVAN) 1 MG tablet Take 1 mg by mouth daily after breakfast.   Yes [provider]  Multiple Vitamins-Minerals (MULTIVITAMIN ADULT PO) Take 1 tablet by mouth daily.   Yes [provider]  OLANZapine (ZYPREXA) 20 MG tablet Take 20 mg by mouth at bedtime.   Yes [provider]  risperiDONE (RISPERDAL) 1 MG tablet Take 1 mg by mouth at bedtime.   Yes [provider]  traZODone (DESYREL) 50 MG tablet Take 50 mg by mouth at bedtime.    Yes [provider]  valproic acid (DEPAKENE) 250 MG/5ML SOLN solution Take 5 mLs by mouth daily after breakfast. 09/16/18  Yes [provider]  furosemide (LASIX) 40 MG tablet Take 1 tablet (40 mg total) by mouth daily. Patient not taking: Reported on 10/05/2018 11/18/16   Geoffery Lyons, MD    Family History Family History  Problem Relation Age of Onset  . Cancer Father   . Stroke Father     Social History Social History   Tobacco Use  . Smoking status: Current Every Day Smoker    Types: Cigarettes  . Smokeless tobacco: Never Used  Substance Use Topics  . Alcohol use: No  . Drug use: No  Allergies   Penicillins   Review of Systems Review of Systems  Unable to perform ROS: Psychiatric disorder  Constitutional: Negative for fever.  Respiratory: Negative for cough.   Musculoskeletal: Positive for arthralgias.     Physical Exam Updated Vital Signs BP 115/67 (BP Location: Left Arm)   Pulse 88   Temp 99.2 F (37.3 C) (Oral)   Resp 20   SpO2 97%   Physical Exam Vitals signs and nursing note reviewed.  Constitutional:      General: She is not in acute distress.    Appearance: She is well-developed. She is not diaphoretic.  HENT:     Head: Normocephalic and atraumatic.  Eyes:     Conjunctiva/sclera: Conjunctivae normal.  Neck:     Musculoskeletal: Normal range of motion.  Cardiovascular:      Rate and Rhythm: Normal rate and regular rhythm.     Heart sounds: Normal heart sounds. No murmur. No friction rub. No gallop.   Pulmonary:     Effort: Pulmonary effort is normal. No respiratory distress.     Breath sounds: Normal breath sounds. No wheezing or rales.  Abdominal:     General: There is no distension.     Palpations: Abdomen is soft.     Tenderness: There is no abdominal tenderness. There is no guarding.  Musculoskeletal:        General: No tenderness.  Skin:    General: Skin is warm and dry.     Findings: No erythema or rash.  Neurological:     Mental Status: She is alert.     Comments: Left facial droop, slurred speech, baseline per pt      ED Treatments / Results  Labs (all labs ordered are listed, but only abnormal results are displayed) Labs Reviewed  COMPREHENSIVE METABOLIC PANEL - Abnormal; Notable for the following components:      Result Value   Glucose, Bld 110 (*)    All other components within normal limits  ACETAMINOPHEN LEVEL - Abnormal; Notable for the following components:   Acetaminophen (Tylenol), Serum <10 (*)    All other components within normal limits  CBC - Abnormal; Notable for the following components:   Hemoglobin 11.6 (*)    RDW 17.5 (*)    All other components within normal limits  URINE CULTURE  ETHANOL  SALICYLATE LEVEL  RAPID URINE DRUG SCREEN, HOSP PERFORMED  URINALYSIS, ROUTINE W REFLEX MICROSCOPIC    EKG None  Radiology Ct Head Wo Contrast  Result Date: 10/05/2018 CLINICAL DATA:  Headache after trauma. Altered mentation. EXAM: CT HEAD WITHOUT CONTRAST TECHNIQUE: Contiguous axial images were obtained from the base of the skull through the vertex without intravenous contrast. COMPARISON:  03/28/2018 FINDINGS: Brain: Encephalomalacia in the right MCA territory is redemonstrated without change. No acute intracranial hemorrhage, infarct intra-axial mass nor extra-axial fluid collections. No hydrocephalus. Midline fourth  ventricle and basal cisterns without effacement. The brainstem and cerebellum appear intact and nonacute. Vascular: No hyperdense vessel or unexpected calcification. Mild atherosclerosis of the carotid siphons. Skull: Normal. Negative for fracture or focal lesion. Sinuses/Orbits: No acute finding. 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 Electronically Signed   By: Tollie Ethavid  Kwon M.D.   On: 10/05/2018 19:45    Procedures Procedures (including critical care time)  Medications Ordered in ED Medications  acetaminophen (TYLENOL) tablet 650 mg (has no administration in time range)  divalproex (DEPAKOTE ER) 24 hr tablet 500 mg (500 mg Oral Given  10/05/18 2229)  traZODone (DESYREL) tablet 50 mg (has no administration in time range)  haloperidol lactate (HALDOL) injection 5 mg (5 mg Intramuscular Given 10/06/18 0636)     Initial Impression / Assessment and Plan / ED Course  I have reviewed the triage vital signs and the nursing notes.  Pertinent labs & imaging results that were available during my care of the patient were reviewed by me and considered in my medical decision making (see chart for details).     68 year old female with history of bipolar, CVA with left hemiplegia, bipolar, presents with IVC that was placed at group home for talking to herself and aggressive behavior towards others and self.  IVC placed.   Patient medically cleared.  Reports leg pain that has been present since 2001 when she had knee surgery. Does not have signs of cellulitis, DVT or arterial thrombus.    TTS placed. Home meds ordered.   Final Clinical Impressions(s) / ED Diagnoses   Final diagnoses:  Aggressive behavior  Bipolar 1 disorder [facility])    ED Discharge Orders    None       Alvira Monday, MD 10/06/18 [phone]

## 2018-10-05 NOTE — ED Triage Notes (Signed)
Per [employer]. IVC papers present. Pt is a resident at a Group Home. Pt has been talking to herself. Pt has been throwing objects at staff and other residents. Pt broke her bed and additional items in her room. Pt hits herself against windows and walls. Pt presents with rolling walker.

## 2018-10-05 NOTE — ED Notes (Signed)
Patient transported to CT 

## 2018-10-06 DIAGNOSIS — F319 Bipolar disorder, unspecified: Secondary | ICD-10-CM | POA: Diagnosis not present

## 2018-10-06 MED ORDER — HALOPERIDOL LACTATE 5 MG/ML IJ SOLN
5.0000 mg | Freq: Once | INTRAMUSCULAR | Status: AC
  Administered 2018-10-06: 5 mg via INTRAMUSCULAR
  Filled 2018-10-06: qty 1

## 2018-10-06 MED ORDER — RISPERIDONE 1 MG PO TABS
1.0000 mg | ORAL_TABLET | Freq: Once | ORAL | Status: DC
  Filled 2018-10-06: qty 1

## 2018-10-06 NOTE — ED Notes (Signed)
Patient agitated and verbally abusive to staff. EDP informed. Haldol 5mg  IM given. Will continue to monitor.

## 2018-10-06 NOTE — Progress Notes (Signed)
10/06/2018  1018  Called Sheriff [phone] to arrange transportation. Spoke with [employer] [facility] info given. Per Sheriff it maybe after 5pm today before they can transport patient.

## 2018-10-06 NOTE — Progress Notes (Signed)
10/06/2018  1010  Called report to [facility] in [place] [phone]. Report given to [facility] on the [address] unit.

## 2018-10-06 NOTE — BH Assessment (Signed)
[facility] Assessment Progress Note  Per Donell Sievert, PA, this pt requires psychiatric hospitalization at this time.  Pt presents under IVC initiated by pt's facility administrator, which EDP Alvira Monday, MD has upheld.  At 09:42 Carney Bern, CSW calls from [facility] to report that pt has been accepted to [facility] by Dr Sara Chu to the 900 hall.  Juanetta Beets, DO concurs with this decision.  Pt's nurse, Addison Naegeli, has been notified, and agrees to call report to [phone]; ask for 900 hall.  Pt is to be transported via [facility].  At the request of [facility] staff, IVC documents have been faxed to [phone].   Doylene Canning Behavioral Health Coordinator [phone]

## 2018-10-07 LAB — URINE CULTURE
